# Patient Record
Sex: Female | Born: 1960 | Race: White | Hispanic: No | Smoking: Never smoker
Health system: Southern US, Community
[De-identification: ages and names within clinical notes are randomized; demographics above are authoritative.]

## PROBLEM LIST (undated history)

## (undated) DIAGNOSIS — I10 Essential (primary) hypertension: Secondary | ICD-10-CM

## (undated) DIAGNOSIS — R42 Dizziness and giddiness: Secondary | ICD-10-CM

## (undated) DIAGNOSIS — M1990 Unspecified osteoarthritis, unspecified site: Secondary | ICD-10-CM

## (undated) DIAGNOSIS — M722 Plantar fascial fibromatosis: Secondary | ICD-10-CM

## (undated) DIAGNOSIS — Q600 Renal agenesis, unilateral: Secondary | ICD-10-CM

## (undated) DIAGNOSIS — H548 Legal blindness, as defined in USA: Secondary | ICD-10-CM

## (undated) DIAGNOSIS — Z98811 Dental restoration status: Secondary | ICD-10-CM

## (undated) DIAGNOSIS — J302 Other seasonal allergic rhinitis: Secondary | ICD-10-CM

## (undated) DIAGNOSIS — G4730 Sleep apnea, unspecified: Secondary | ICD-10-CM

## (undated) DIAGNOSIS — M21619 Bunion of unspecified foot: Secondary | ICD-10-CM

## (undated) DIAGNOSIS — M2580 Other specified joint disorders, unspecified joint: Secondary | ICD-10-CM

## (undated) DIAGNOSIS — R000 Tachycardia, unspecified: Secondary | ICD-10-CM

## (undated) DIAGNOSIS — E119 Type 2 diabetes mellitus without complications: Secondary | ICD-10-CM

## (undated) DIAGNOSIS — E7800 Pure hypercholesterolemia, unspecified: Secondary | ICD-10-CM

## (undated) DIAGNOSIS — M719 Bursopathy, unspecified: Secondary | ICD-10-CM

## (undated) DIAGNOSIS — Z973 Presence of spectacles and contact lenses: Secondary | ICD-10-CM

## (undated) DIAGNOSIS — D649 Anemia, unspecified: Secondary | ICD-10-CM

## (undated) HISTORY — PX: TONSILECTOMY, ADENOIDECTOMY, BILATERAL MYRINGOTOMY AND TUBES: SHX2538

## (undated) HISTORY — DX: Bursopathy, unspecified: M71.9

## (undated) HISTORY — DX: Plantar fascial fibromatosis: M72.2

## (undated) HISTORY — DX: Type 2 diabetes mellitus without complications: E11.9

## (undated) HISTORY — DX: Pure hypercholesterolemia, unspecified: E78.00

## (undated) HISTORY — PX: KIDNEY SURGERY: SHX687

## (undated) HISTORY — PX: PARTIAL HYSTERECTOMY: SHX80

## (undated) HISTORY — PX: TUBAL LIGATION: SHX77

## (undated) HISTORY — PX: ABDOMINAL HYSTERECTOMY: SHX81

## (undated) HISTORY — PX: TOE SURGERY: SHX1073

## (undated) HISTORY — DX: Bunion of unspecified foot: M21.619

## (undated) HISTORY — PX: RHINOPLASTY: SUR1284

## (undated) HISTORY — DX: Other specified joint disorders, unspecified joint: M25.80

---

## 2006-08-15 ENCOUNTER — Ambulatory Visit: Payer: Self-pay

## 2007-12-06 ENCOUNTER — Emergency Department: Payer: Self-pay | Admitting: Emergency Medicine

## 2007-12-17 ENCOUNTER — Ambulatory Visit: Payer: Self-pay | Admitting: Gastroenterology

## 2009-07-18 ENCOUNTER — Emergency Department: Payer: Self-pay | Admitting: Emergency Medicine

## 2009-08-02 ENCOUNTER — Emergency Department: Payer: Self-pay | Admitting: Emergency Medicine

## 2010-03-31 ENCOUNTER — Ambulatory Visit: Payer: Self-pay | Admitting: Internal Medicine

## 2010-04-02 ENCOUNTER — Ambulatory Visit: Payer: Self-pay | Admitting: Internal Medicine

## 2010-04-04 ENCOUNTER — Ambulatory Visit: Payer: Self-pay | Admitting: Internal Medicine

## 2010-04-07 ENCOUNTER — Ambulatory Visit: Payer: Self-pay | Admitting: Internal Medicine

## 2010-07-20 DIAGNOSIS — F4322 Adjustment disorder with anxiety: Secondary | ICD-10-CM | POA: Insufficient documentation

## 2010-08-20 ENCOUNTER — Ambulatory Visit: Payer: Self-pay | Admitting: Internal Medicine

## 2010-09-01 ENCOUNTER — Ambulatory Visit: Payer: Self-pay | Admitting: Internal Medicine

## 2010-09-28 ENCOUNTER — Ambulatory Visit: Payer: Self-pay | Admitting: Emergency Medicine

## 2011-11-01 HISTORY — PX: BREAST BIOPSY: SHX20

## 2011-12-02 DIAGNOSIS — I10 Essential (primary) hypertension: Secondary | ICD-10-CM | POA: Insufficient documentation

## 2011-12-02 DIAGNOSIS — E785 Hyperlipidemia, unspecified: Secondary | ICD-10-CM | POA: Insufficient documentation

## 2011-12-09 ENCOUNTER — Ambulatory Visit: Payer: Self-pay | Admitting: Internal Medicine

## 2011-12-23 ENCOUNTER — Ambulatory Visit: Payer: Self-pay | Admitting: Emergency Medicine

## 2011-12-23 LAB — HEMOGLOBIN: HGB: 13.8 g/dL (ref 12.0–16.0)

## 2011-12-29 ENCOUNTER — Ambulatory Visit: Payer: Self-pay | Admitting: Emergency Medicine

## 2011-12-31 LAB — PATHOLOGY REPORT

## 2013-04-23 ENCOUNTER — Ambulatory Visit: Payer: Self-pay | Admitting: Internal Medicine

## 2013-05-23 DIAGNOSIS — M722 Plantar fascial fibromatosis: Secondary | ICD-10-CM

## 2013-05-23 HISTORY — DX: Plantar fascial fibromatosis: M72.2

## 2013-06-20 DIAGNOSIS — M719 Bursopathy, unspecified: Secondary | ICD-10-CM

## 2013-06-20 DIAGNOSIS — M2580 Other specified joint disorders, unspecified joint: Secondary | ICD-10-CM

## 2013-06-20 HISTORY — DX: Other specified joint disorders, unspecified joint: M25.80

## 2013-06-20 HISTORY — DX: Bursopathy, unspecified: M71.9

## 2013-07-22 ENCOUNTER — Encounter: Payer: Self-pay | Admitting: *Deleted

## 2013-07-22 DIAGNOSIS — M719 Bursopathy, unspecified: Secondary | ICD-10-CM | POA: Insufficient documentation

## 2013-07-22 DIAGNOSIS — M2580 Other specified joint disorders, unspecified joint: Secondary | ICD-10-CM | POA: Insufficient documentation

## 2013-07-22 DIAGNOSIS — M21619 Bunion of unspecified foot: Secondary | ICD-10-CM | POA: Insufficient documentation

## 2013-07-22 DIAGNOSIS — M722 Plantar fascial fibromatosis: Secondary | ICD-10-CM

## 2013-08-01 ENCOUNTER — Encounter: Payer: Self-pay | Admitting: Podiatry

## 2013-08-01 ENCOUNTER — Ambulatory Visit (INDEPENDENT_AMBULATORY_CARE_PROVIDER_SITE_OTHER): Payer: BC Managed Care – PPO

## 2013-08-01 ENCOUNTER — Ambulatory Visit (INDEPENDENT_AMBULATORY_CARE_PROVIDER_SITE_OTHER): Payer: BC Managed Care – PPO | Admitting: Podiatry

## 2013-08-01 VITALS — BP 126/86 | HR 90 | Temp 98.0°F | Resp 16 | Ht 68.0 in | Wt 195.2 lb

## 2013-08-01 DIAGNOSIS — M2011 Hallux valgus (acquired), right foot: Secondary | ICD-10-CM

## 2013-08-01 DIAGNOSIS — M2010 Hallux valgus (acquired), unspecified foot: Secondary | ICD-10-CM

## 2013-08-01 NOTE — Progress Notes (Signed)
Gabriella Young presents today for her first postop visit. 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 Painful sesamoiditis IP joint hallux right and a soft tissue mass plantar aspect of the hallux right. She presents today stating that she had to change the dressing because it had blood on it and she has a problem with keeping things neat and clean. She denies fever chills nausea vomiting states it really hasn't hurt too badly.  Objective: Dry sterile dressing was intact today patient presents with her Cam Walker. Dry sterile dressing was removed demonstrates no erythema tali this drainage or odor. Mild edema mild tenderness on range of motion right first metatarsophalangeal joint. Radiographic evaluation demonstrates well-placed internal fixation IPJ fusion hallux right no other osseous abnormalities are noted.  Assessment: Status post McBride bunion repair, hallux interphalangeal joint fusion, IP sesamoidectomy hallux right, excision bursa hallux right, x1 week.  Plan: Redressed today with a dry sterile compressive dressing will followup with her in one week for suture removal.

## 2013-08-01 NOTE — Patient Instructions (Signed)
Continue post op surgical instructions.  Continue limited time up on the foot. Keep dressing intact and dry. Ice and elevate as much as possible.  Follow up with me in one week for suture removal.  Continue antibiotics until finished.    Be careful and we will see you next week.

## 2013-08-08 ENCOUNTER — Ambulatory Visit (INDEPENDENT_AMBULATORY_CARE_PROVIDER_SITE_OTHER): Payer: BC Managed Care – PPO | Admitting: Podiatry

## 2013-08-08 ENCOUNTER — Encounter: Payer: Self-pay | Admitting: Podiatry

## 2013-08-08 VITALS — BP 132/92 | HR 91 | Temp 98.1°F | Resp 16 | Wt 195.2 lb

## 2013-08-08 DIAGNOSIS — M2030 Hallux varus (acquired), unspecified foot: Secondary | ICD-10-CM

## 2013-08-08 DIAGNOSIS — M2010 Hallux valgus (acquired), unspecified foot: Secondary | ICD-10-CM | POA: Insufficient documentation

## 2013-08-08 DIAGNOSIS — M205X1 Other deformities of toe(s) (acquired), right foot: Secondary | ICD-10-CM | POA: Insufficient documentation

## 2013-08-08 DIAGNOSIS — M205X2 Other deformities of toe(s) (acquired), left foot: Secondary | ICD-10-CM

## 2013-08-08 NOTE — Progress Notes (Signed)
Lillianah presents today for followup of her surgical foot right. She is status post 2 weeks now McBride bunion repair right. Hallux interphalangeal joint arthrodesis and excision of bursa plantar aspect of the hallux. She states it seems to be doing okay. She denies fever chills nausea vomiting muscle aches and pains.  Objective: Vital signs are stable she is alert and oriented x3. Pulses are palpable right lower extremity strong. Capillary fill time to digits one through 5 the right foot is immediate. She has a great range of motion of the first metatarsophalangeal joint right foot. It is well coapted sutures are intact. No erythema edema cellulitis drainage or odor.  Assessment: Well-healing surgical foot right date of surgery 07/26/2013.  Plan: Followup with her in 2 weeks for another set of x-rays right foot. She will use Coban and to the hallux right. An anklet to the right foot. And a Darco shoe to the right foot.

## 2013-08-08 NOTE — Patient Instructions (Signed)
Continue to wear the anklet and limit time up on the foot.  May shower. Follow up with me in two weeks.

## 2013-08-15 ENCOUNTER — Encounter: Payer: Self-pay | Admitting: Podiatry

## 2013-08-15 ENCOUNTER — Encounter: Payer: Self-pay | Admitting: *Deleted

## 2013-08-16 ENCOUNTER — Telehealth: Payer: Self-pay | Admitting: *Deleted

## 2013-08-16 NOTE — Telephone Encounter (Signed)
PT CALLED WANTING A NOTE TO WORK FROM HOME TILL 10.24.14. SAID SHE HAD FELL WITH HER BOOT ON AND HER TOE WAS SORE. ASKED IF SHE WOULD LIKE TO COME IN TO SEE DR HYATT AND SAID NO SHE WOULD BE FINE CAUSE I HAD THE BOOT ON WHEN I FELL. I WILL BE OK.

## 2013-08-21 ENCOUNTER — Ambulatory Visit (INDEPENDENT_AMBULATORY_CARE_PROVIDER_SITE_OTHER): Payer: BC Managed Care – PPO

## 2013-08-21 ENCOUNTER — Ambulatory Visit (INDEPENDENT_AMBULATORY_CARE_PROVIDER_SITE_OTHER): Payer: BC Managed Care – PPO | Admitting: Podiatry

## 2013-08-21 ENCOUNTER — Encounter: Payer: Self-pay | Admitting: Podiatry

## 2013-08-21 VITALS — BP 146/90 | HR 85 | Resp 16 | Ht 68.0 in | Wt 192.0 lb

## 2013-08-21 DIAGNOSIS — M2030 Hallux varus (acquired), unspecified foot: Secondary | ICD-10-CM

## 2013-08-21 DIAGNOSIS — M21611 Bunion of right foot: Secondary | ICD-10-CM

## 2013-08-21 DIAGNOSIS — M2031 Hallux varus (acquired), right foot: Secondary | ICD-10-CM

## 2013-08-21 DIAGNOSIS — M21619 Bunion of unspecified foot: Secondary | ICD-10-CM

## 2013-08-21 NOTE — Progress Notes (Signed)
[name] presents today for followup of her first metatarsophalangeal joint repair right as well as IPJ fusion hallux right. She states it is a little sore but is doing pretty good. Date of surgery was 07/26/2013.  Objective: vital signs are stable she is alert and oriented x3. Hallux demonstrates much decrease in edema since the last visit. Radiographic evaluation does demonstrate some mild distraction of the IP arthrodesis attempt.  Assessment: Well-healing surgical foot mild distraction of the IPJ fusion.  Plan: Encouraged range of motion and compression to hallux. I suggested she start putting lotion and or vitamin E oil or cocoa butter on her foot to help with a dry skin. I will followup with her in 2-4 weeks for another set of x-rays continue use of the Darco shoe until then.

## 2013-08-22 ENCOUNTER — Encounter: Payer: Self-pay | Admitting: Podiatry

## 2013-09-05 ENCOUNTER — Ambulatory Visit (INDEPENDENT_AMBULATORY_CARE_PROVIDER_SITE_OTHER): Payer: BC Managed Care – PPO

## 2013-09-05 ENCOUNTER — Encounter: Payer: Self-pay | Admitting: Podiatry

## 2013-09-05 ENCOUNTER — Ambulatory Visit (INDEPENDENT_AMBULATORY_CARE_PROVIDER_SITE_OTHER): Payer: BC Managed Care – PPO | Admitting: Podiatry

## 2013-09-05 ENCOUNTER — Other Ambulatory Visit: Payer: Self-pay

## 2013-09-05 VITALS — BP 134/92 | HR 88 | Resp 16 | Ht 68.0 in | Wt 195.0 lb

## 2013-09-05 DIAGNOSIS — Z9889 Other specified postprocedural states: Secondary | ICD-10-CM

## 2013-09-05 NOTE — Progress Notes (Signed)
Gabriella Young presents today for followup of her McBride bunion repair, hallux IPJ fusion and resection of the bursa to the hallux right. Last time I saw her she was doing quite well she had a fantastic range of motion the first metatarsophalangeal joint right foot. There was minimal edema and minimal pain. Currently she relates that she recently fell with her Darco shoe on, rolling her foot backwards in her foot is been sore ever since.  Objective: She has edema to the right foot tenderness and tingling on palpation of the first metatarsophalangeal joint as well as the IP joint hallux right. Radiographic evaluation today does demonstrate a mild distraction of the IP joint. These were usually the wound healing away.  Assessment: A slight delay in healing secondary to trauma.  Plan: I encouraged her to continue range of motion exercises massage therapy and getting into a loose pair shoes. This should help brace her range of motion and desensitize her. I will followup with her in 6 weeks.

## 2013-10-03 ENCOUNTER — Encounter: Payer: Self-pay | Admitting: Podiatry

## 2013-10-03 ENCOUNTER — Ambulatory Visit (INDEPENDENT_AMBULATORY_CARE_PROVIDER_SITE_OTHER): Payer: BC Managed Care – PPO

## 2013-10-03 ENCOUNTER — Ambulatory Visit (INDEPENDENT_AMBULATORY_CARE_PROVIDER_SITE_OTHER): Payer: BC Managed Care – PPO | Admitting: Podiatry

## 2013-10-03 VITALS — BP 145/90 | HR 90 | Ht 68.0 in | Wt 195.0 lb

## 2013-10-03 DIAGNOSIS — Z9889 Other specified postprocedural states: Secondary | ICD-10-CM

## 2013-10-05 NOTE — Progress Notes (Signed)
Gabriella Young presents today for followup of her McBride bunion repair and her IPJ fusion hallux right she states it is doing a lot better and the swelling is going down.  Objective: Vital signs are stable she is alert and oriented x3. She has a great range of motion of the first metatarsophalangeal joint which might with much decrease in edema to the hallux right. Radiographic evaluation does demonstrate that the hallux has some healing left to do at the arthrodesis site but should going to heal quite nicely.  Assessment: Well-healing surgical foot status post McBride and hallux IPJ arthrodesis.  Plan: Followup with me as needed.

## 2013-10-07 ENCOUNTER — Telehealth: Payer: Self-pay | Admitting: *Deleted

## 2013-10-07 MED ORDER — MELOXICAM 7.5 MG PO TABS: 7.5000 mg | ORAL_TABLET | Freq: Every day | ORAL | Status: DC

## 2013-10-07 NOTE — Telephone Encounter (Signed)
You may refill times 3.

## 2013-10-07 NOTE — Telephone Encounter (Signed)
Fax request from pharmacy requesting mobic 7.5 mg tablets take one tablet by mouth every day

## 2013-11-08 ENCOUNTER — Ambulatory Visit: Payer: Self-pay | Admitting: Nurse Practitioner

## 2013-11-14 ENCOUNTER — Encounter: Payer: BC Managed Care – PPO | Admitting: Podiatry

## 2013-11-18 ENCOUNTER — Encounter: Payer: Self-pay | Admitting: Podiatry

## 2013-11-18 ENCOUNTER — Ambulatory Visit (INDEPENDENT_AMBULATORY_CARE_PROVIDER_SITE_OTHER): Payer: BC Managed Care – PPO | Admitting: Podiatry

## 2013-11-18 ENCOUNTER — Ambulatory Visit (INDEPENDENT_AMBULATORY_CARE_PROVIDER_SITE_OTHER): Payer: BC Managed Care – PPO

## 2013-11-18 VITALS — BP 117/87 | HR 115 | Resp 16

## 2013-11-18 DIAGNOSIS — Z9889 Other specified postprocedural states: Secondary | ICD-10-CM

## 2013-11-18 NOTE — Progress Notes (Signed)
Dos 9.26.14 right foot , doing pretty well, i think he just wanted to check it one last time.  Objective evaluation: Pulses are strongly palpable to the right lower extremity. McBride bunion repair and arthrodesis of the IP joint of the hallux right appears to be healing quite nicely there is no erythema no edema cellulitis drainage or odor. She has no pain on range of motion of the toe. Radiographic evaluation demonstrates well-healing surgical foot right.  Assessment: Well-healing surgical foot status post IPJ fusion and McBride bunion repair right.  Plan: Get back to regular routine all followup with her on as-needed basis.

## 2014-02-07 ENCOUNTER — Other Ambulatory Visit: Payer: Self-pay | Admitting: Podiatry

## 2014-10-05 IMAGING — CR RIGHT FOOT COMPLETE - 3+ VIEW
1 series · 3 of 3 positions shown · non-contrast
Comparison: none

REASON FOR EXAM: Right foot for pain and edema
COMMENTS:

PROCEDURE:     DXR - DXR FOOT RT COMPLETE W/OBLIQUES  - April 23, 2013 [DATE]
RESULT:     Right foot images demonstrate some degenerative change
especially in the first metatarsophalangeal joint without evidence of
fracture, dislocation or foreign body.

[Series 1: ap · 0.17mm/px · 3 of 3 slices shown]
[im 1/3]
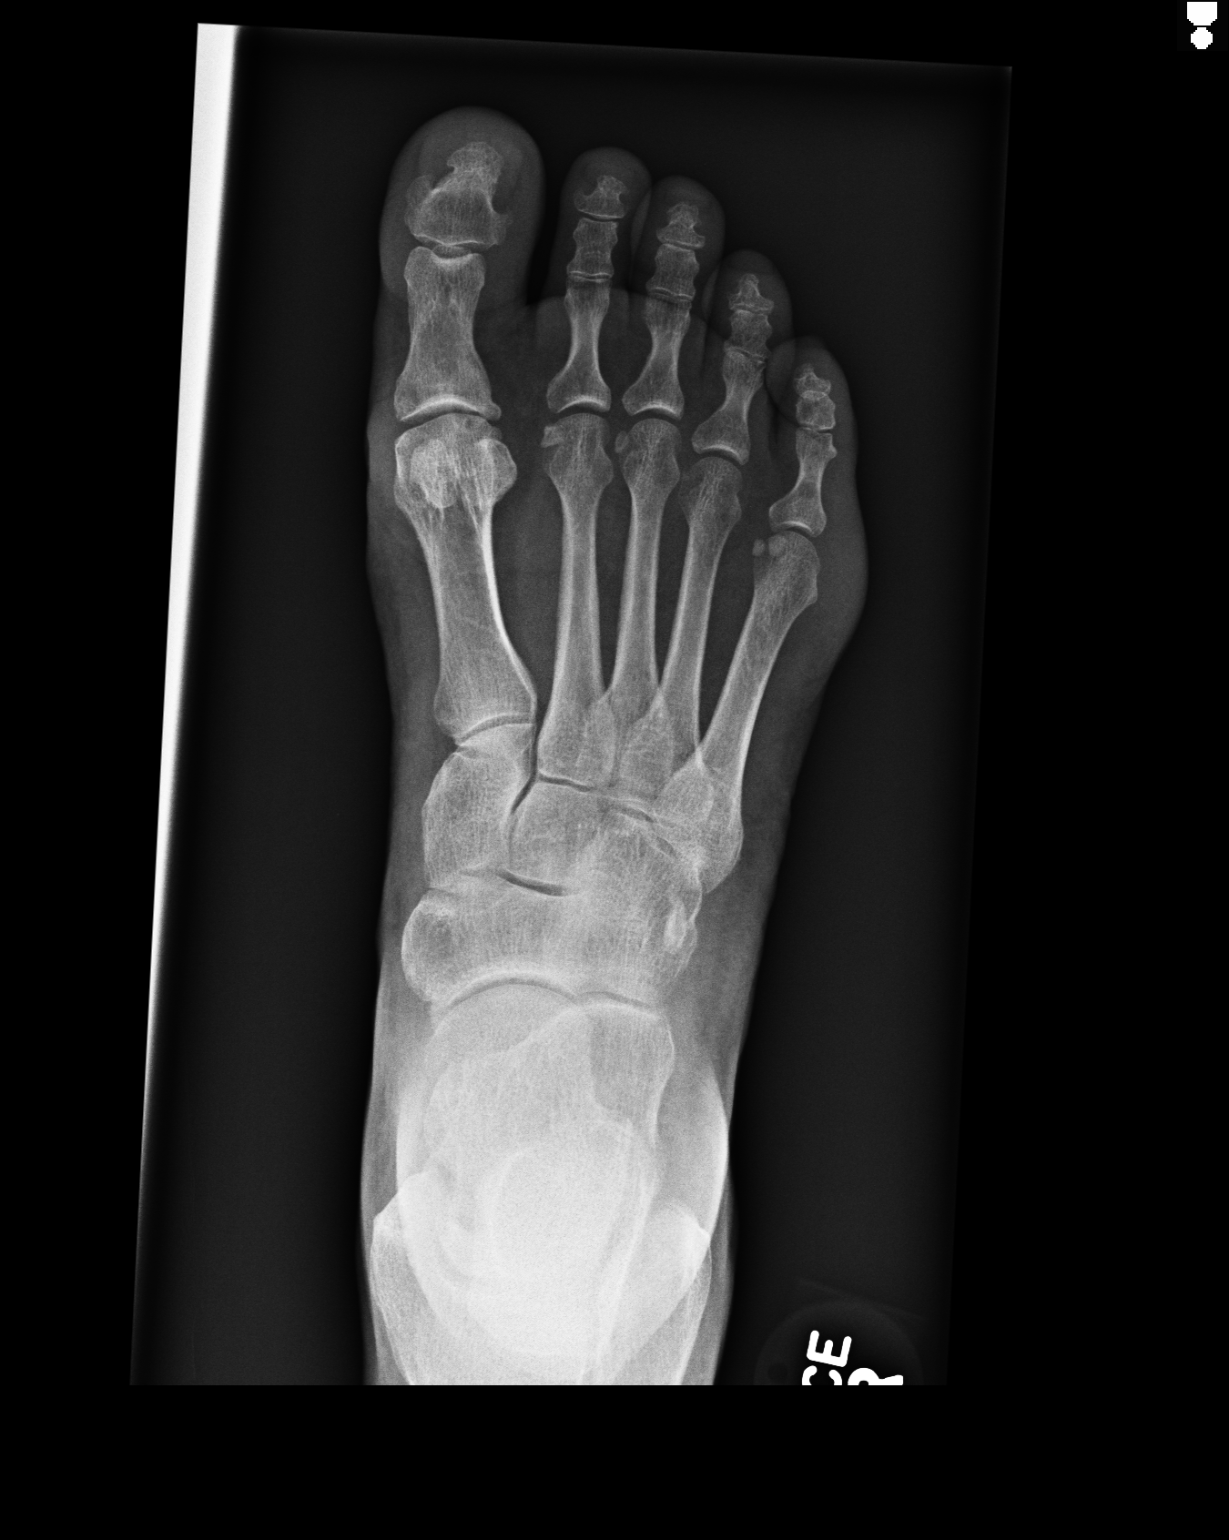
[im 2/3]
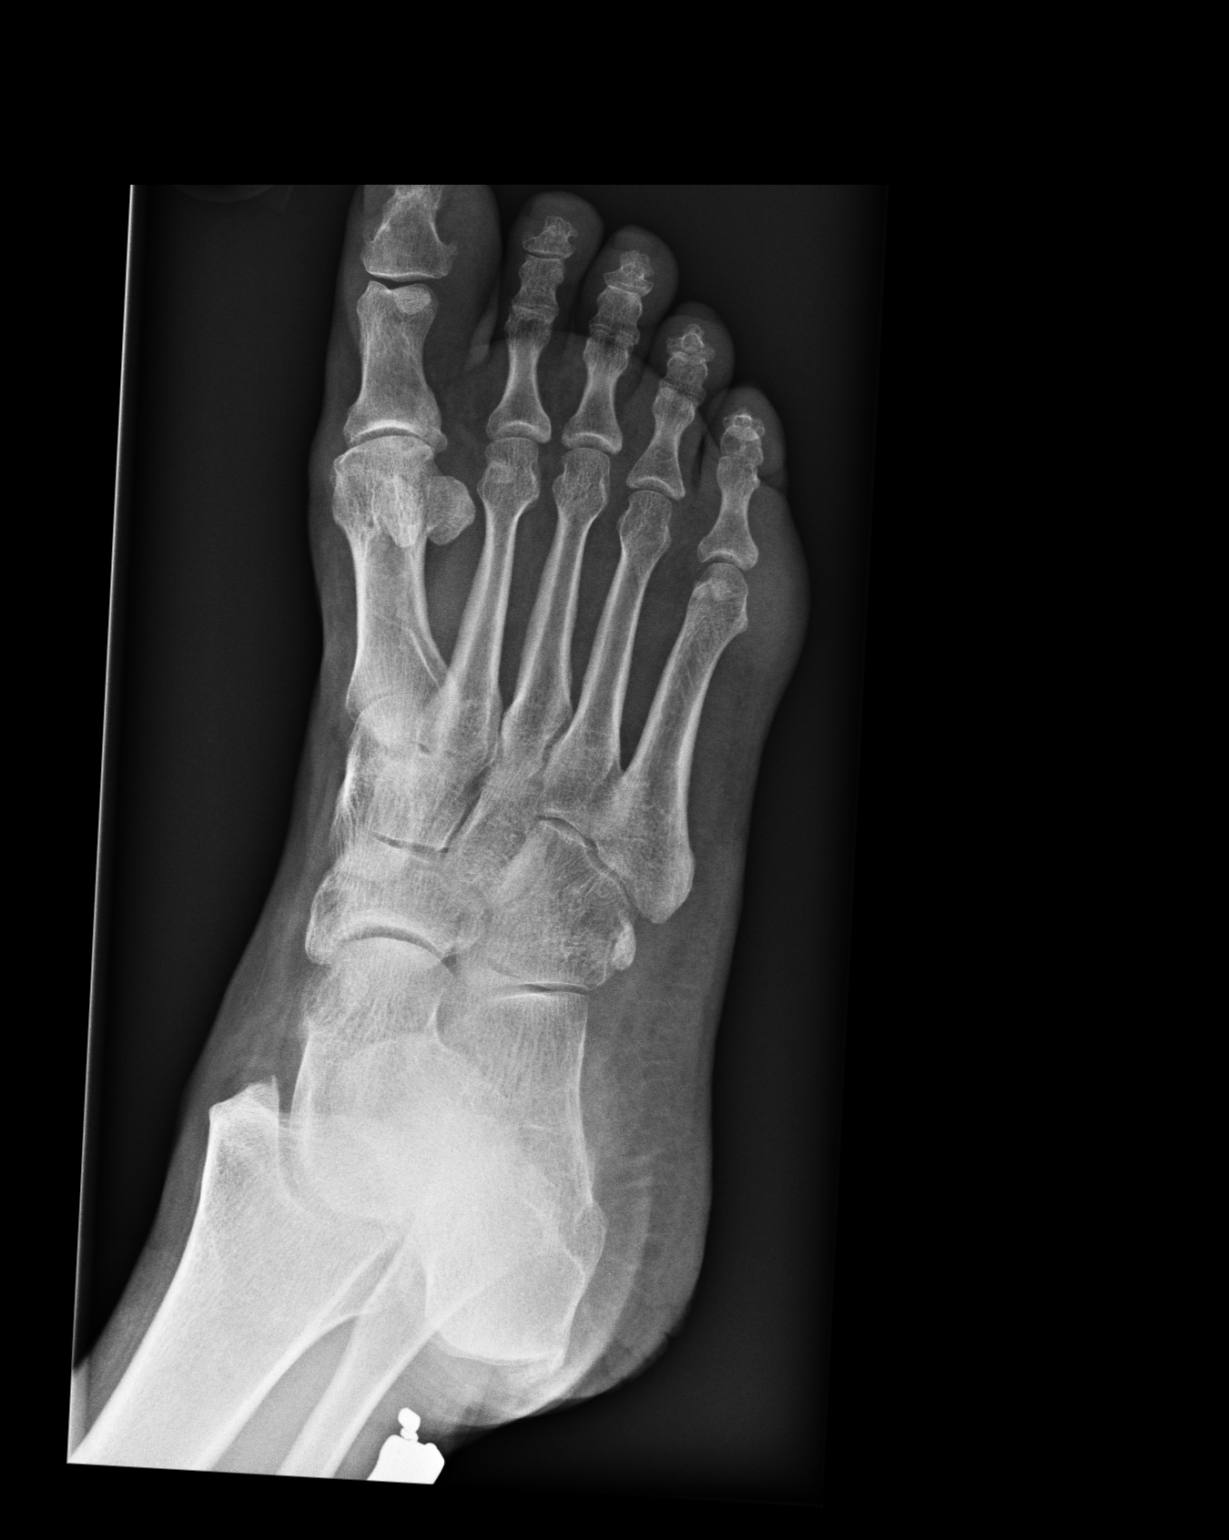
[im 3/3]
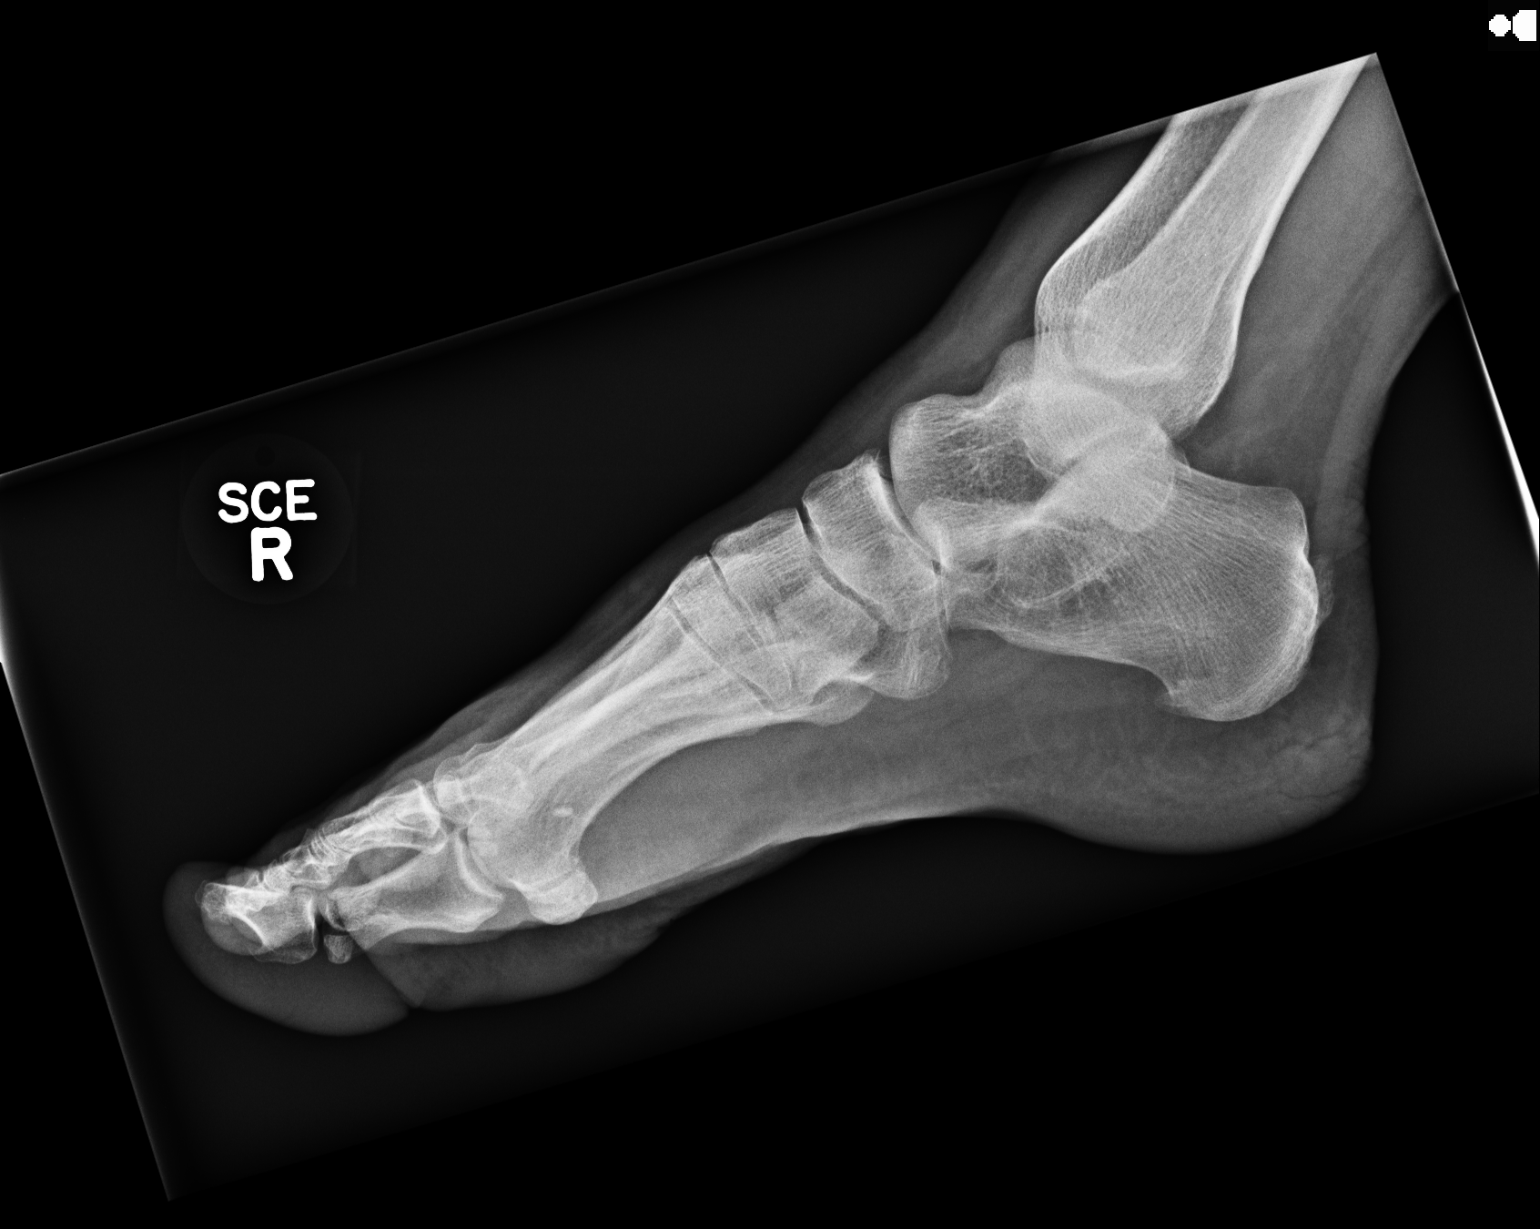

[3 of 3 positions shown; findings below may reference images not displayed]

IMPRESSION: 1. No acute bony abnormality. Mild chronic degenerative changes are present.

[REDACTED]

## 2014-12-11 ENCOUNTER — Ambulatory Visit: Payer: Self-pay | Admitting: Nurse Practitioner

## 2015-02-22 NOTE — Op Note (Signed)
PATIENT NAME:  Gabriella Young, Natsha D MR#:  [id number] DATE OF BIRTH:  Jul 04, 1961  DATE OF PROCEDURE:  12/29/2011  PREOPERATIVE DIAGNOSIS: Left breast tumor.   POSTOPERATIVE DIAGNOSIS: Left breast tumor.   PROCEDURE: Left breast needle localization biopsy.   ANESTHESIA:  General.   DESCRIPTION OF PROCEDURE:  This is a patient who has a history of multiple cysts in the breast. Last time she was scheduled for biopsy and it was aspirated by radiologist. This time they thought she might have more cysts and she had a little solid mass next to them. The patient was then brought to x-ray and they did an ultrasound guided needle localization. She was then brought to surgery under general anesthesia. The left breast was then prepped and draped. An incision was made over the top of the wire. After cutting skin and subcutaneous tissue, the wire does not seem to be that deep. Dissection was done to take out the wire and some of the breast tissue with it. There are multiple cysts in the breast which were then removed with the specimen and after that, bleeding was stopped and the wound was then closed in layers with 3-0 Vicryl and 5-0 Vicryl sutures. Steri-Strips were applied. The patient tolerated the procedure well and was sent to the recovery room in satisfactory condition.    ____________________________ Alton RevereMasud S. Cecelia ByarsHashmi, MD msh:ap D: 12/29/2011 11:52:15 ET T: 12/29/2011 12:18:53 ET JOB#: [id number]  cc: Swain Acree S. Cecelia ByarsHashmi, MD, <Dictator> Margaretann LovelessNeelam S. Khan, MD Meryle ReadyMASUD S Juddson Cobern MD ELECTRONICALLY SIGNED 01/03/2012 13:13

## 2015-03-06 DIAGNOSIS — R079 Chest pain, unspecified: Secondary | ICD-10-CM | POA: Insufficient documentation

## 2015-04-22 IMAGING — MG MM DIGITAL SCREENING BILAT W/ CAD
1 series · 5 of 5 positions shown · non-contrast
Comparison: Previous exam(s).

CLINICAL DATA: Screening.

EXAM:
DIGITAL SCREENING BILATERAL MAMMOGRAM WITH CAD

[R CC · right · 5 of 5 slices shown]
[im 1/5]
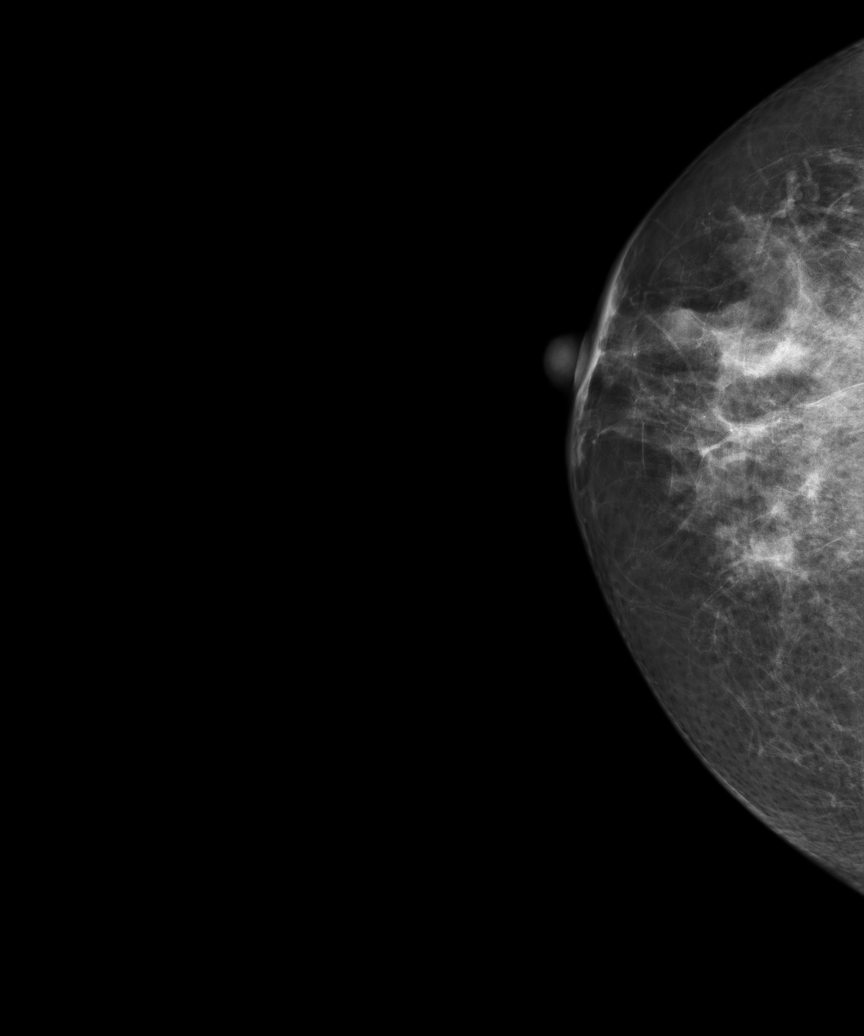
[im 2/5]
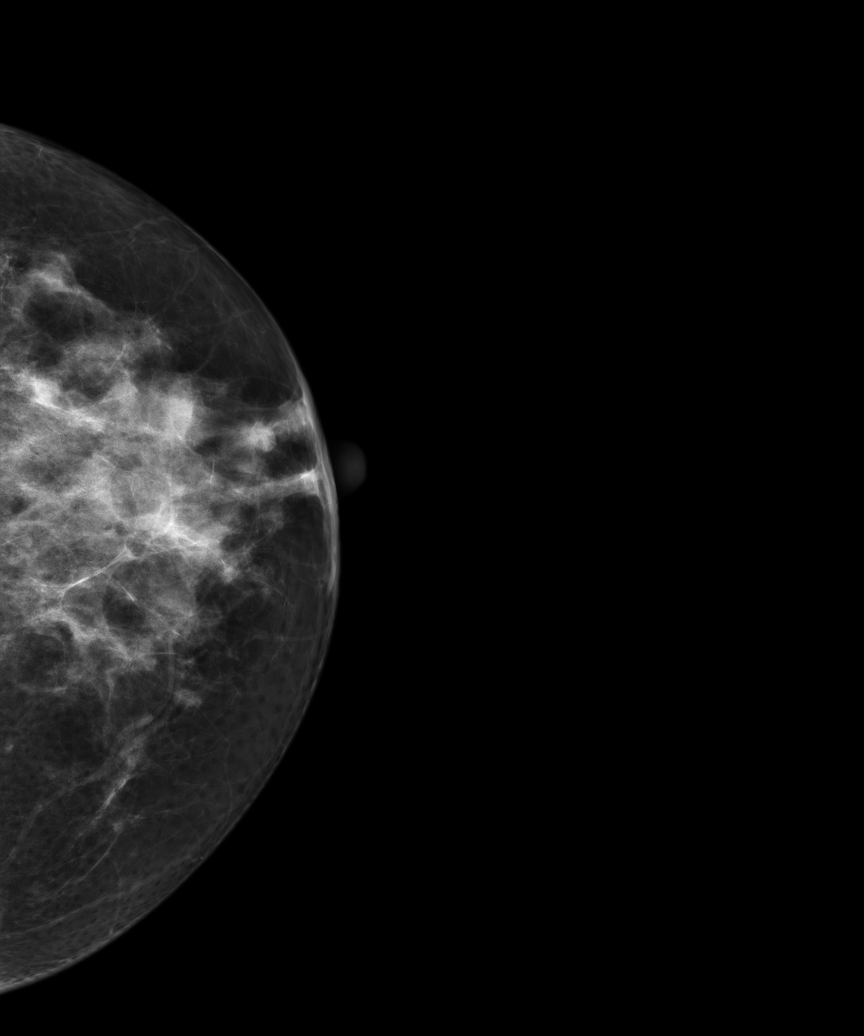
[im 3/5]
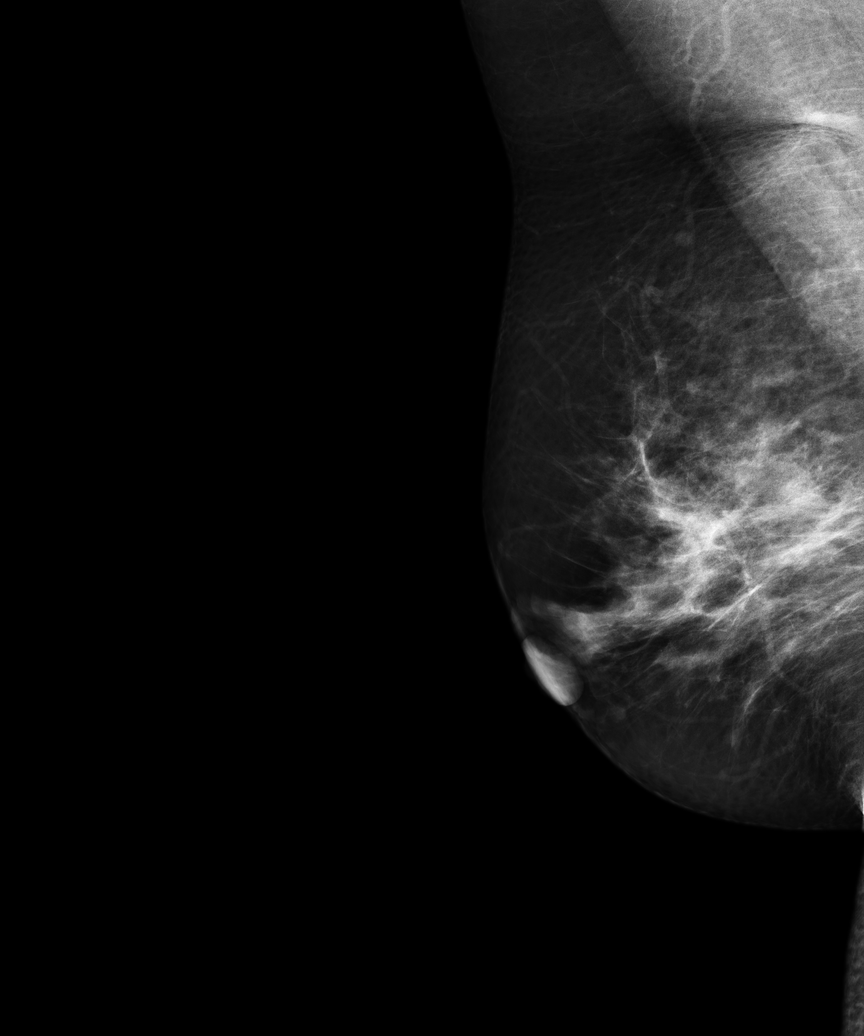
[im 4/5]
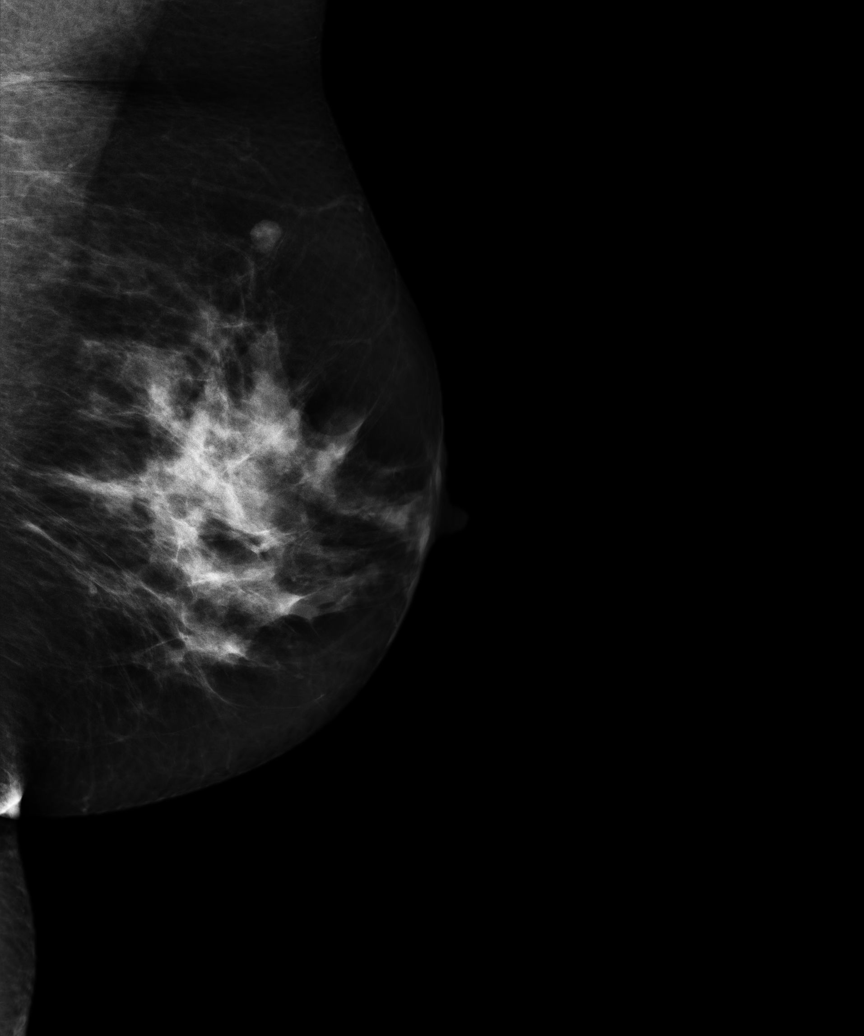
[im 5/5]
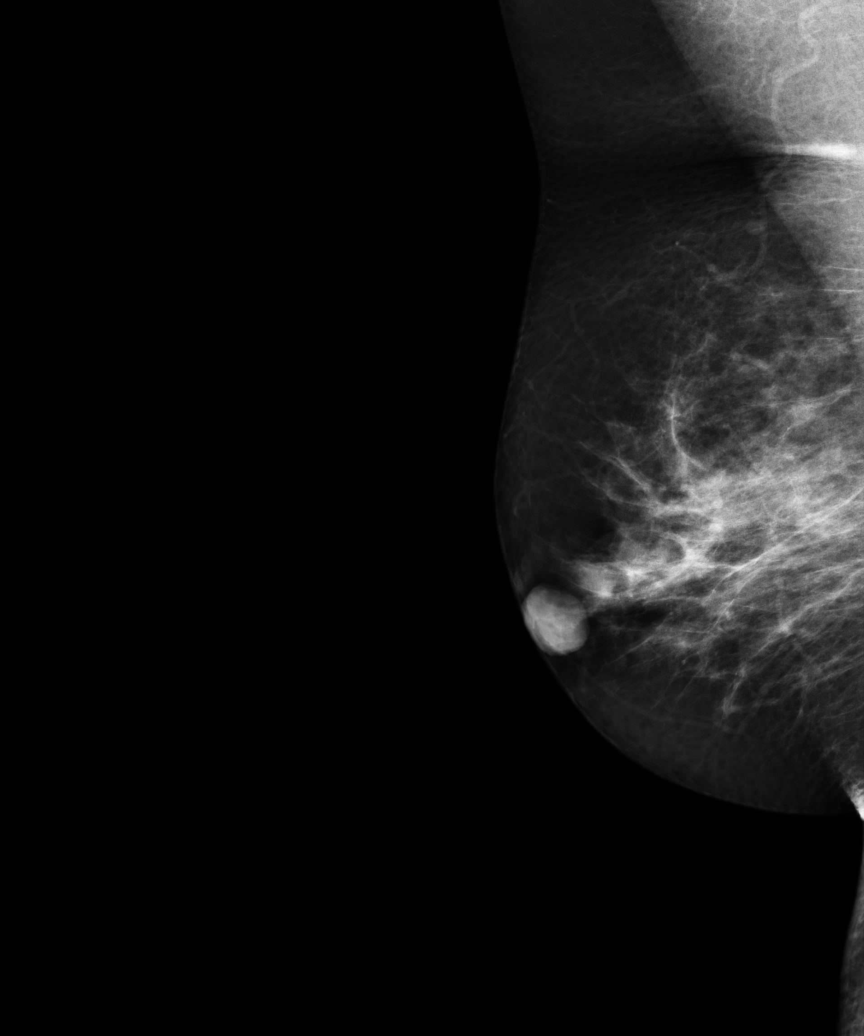

[5 of 5 positions shown; findings below may reference images not displayed]

ACR Breast Density Category d: The breast tissue is extremely dense,
which lowers the sensitivity of mammography.
FINDINGS: There are no findings suspicious for malignancy. Images were
processed with CAD.
IMPRESSION: No mammographic evidence of malignancy. A result letter of this
screening mammogram will be mailed directly to the patient.

RECOMMENDATION:
Screening mammogram in one year. (Code:26-Q-M5K)

BI-RADS CATEGORY  1: Negative

## 2015-07-30 ENCOUNTER — Other Ambulatory Visit: Payer: Self-pay

## 2015-07-30 ENCOUNTER — Telehealth: Payer: Self-pay

## 2015-07-30 NOTE — Telephone Encounter (Signed)
Gastroenterology Pre-Procedure Review  Request Date: 08/28/15 Requesting Physician: Dr.   PATIENT REVIEW QUESTIONS: The patient responded to the following health history questions as indicated:    1. Are you having any GI issues? no 2. Do you have a personal history of Polyps? yes (2008) 3. Do you have a family history of Colon Cancer or Polyps?  No  4. Diabetes Mellitus? no 5. Joint replacements in the past 12 months?no 6. Major health problems in the past 3 months?no 7. Any artificial heart valves, MVP, or defibrillator? Tachycardia    MEDICATIONS & ALLERGIES:    Patient reports the following regarding taking any anticoagulation/antiplatelet therapy:   Plavix, Coumadin, Eliquis, Xarelto, Lovenox, Pradaxa, Brilinta, or Effient? no Aspirin? no  Patient confirms/reports the following medications:  Current Outpatient Prescriptions  Medication Sig Dispense Refill  . alprazolam (XANAX) 2 MG tablet Take 2 mg by mouth daily.    . meloxicam (MOBIC) 7.5 MG tablet Take 1 tablet (7.5 mg total) by mouth daily. 30 tablet 3  . oxyCODONE-acetaminophen (PERCOCET) 10-325 MG per tablet Take by mouth. 1- 2 EVERY 6-8 HOURS AS NEEDED     No current facility-administered medications for this visit.    Patient confirms/reports the following allergies:  Allergies  Allergen Reactions  . Codeine   . Sulfa Antibiotics     No orders of the defined types were placed in this encounter.    AUTHORIZATION INFORMATION Primary Insurance: 1D#: Group #:  Secondary Insurance: 1D#: Group #:  SCHEDULE INFORMATION: Date: 08/28/15 Time: Location: [facility]

## 2015-09-02 ENCOUNTER — Encounter: Payer: Self-pay | Admitting: Anesthesiology

## 2015-09-04 NOTE — Discharge Instructions (Signed)

## 2015-09-07 ENCOUNTER — Ambulatory Visit
Admission: RE | Admit: 2015-09-07 | Payer: BLUE CROSS/BLUE SHIELD | Source: Ambulatory Visit | Admitting: Gastroenterology

## 2015-09-07 ENCOUNTER — Telehealth: Payer: Self-pay | Admitting: Gastroenterology

## 2015-09-07 HISTORY — DX: Renal agenesis, unilateral: Q60.0

## 2015-09-07 HISTORY — DX: Other seasonal allergic rhinitis: J30.2

## 2015-09-07 HISTORY — DX: Tachycardia, unspecified: R00.0

## 2015-09-07 HISTORY — DX: Essential (primary) hypertension: I10

## 2015-09-07 HISTORY — DX: Anemia, unspecified: D64.9

## 2015-09-07 HISTORY — DX: Dental restoration status: Z98.811

## 2015-09-07 HISTORY — DX: Dizziness and giddiness: R42

## 2015-09-07 HISTORY — DX: Unspecified osteoarthritis, unspecified site: M19.90

## 2015-09-07 HISTORY — DX: Legal blindness, as defined in USA: H54.8

## 2015-09-07 SURGERY — COLONOSCOPY WITH PROPOFOL
Anesthesia: Choice

## 2015-09-07 NOTE — Telephone Encounter (Signed)
Patient left a voice message that she needed to cancel colonoscopy because she couldn't drink the second part of her prep without throwing up. Please call to reschedule.

## 2015-09-09 NOTE — Telephone Encounter (Signed)
LVM for pt to return my call.

## 2015-09-16 NOTE — Telephone Encounter (Signed)
LVM again for pt to return my call to reschedule colonoscopy.  

## 2015-12-17 ENCOUNTER — Other Ambulatory Visit: Payer: Self-pay | Admitting: Nurse Practitioner

## 2015-12-17 DIAGNOSIS — Z1231 Encounter for screening mammogram for malignant neoplasm of breast: Secondary | ICD-10-CM

## 2015-12-28 ENCOUNTER — Ambulatory Visit
Admission: RE | Admit: 2015-12-28 | Discharge: 2015-12-28 | Disposition: A | Discharge status: Home / Self Care | Payer: BLUE CROSS/BLUE SHIELD | Source: Ambulatory Visit | Attending: Nurse Practitioner | Admitting: Nurse Practitioner

## 2015-12-28 DIAGNOSIS — Z1231 Encounter for screening mammogram for malignant neoplasm of breast: Secondary | ICD-10-CM | POA: Insufficient documentation

## 2016-06-29 ENCOUNTER — Telehealth: Payer: Self-pay

## 2016-06-29 ENCOUNTER — Other Ambulatory Visit: Payer: Self-pay

## 2016-06-29 ENCOUNTER — Encounter: Payer: Self-pay | Admitting: *Deleted

## 2016-06-29 NOTE — Telephone Encounter (Signed)
Dysphagia R13.10 [facility] 07/07/2016 Please cert

## 2016-06-29 NOTE — Telephone Encounter (Signed)
Gastroenterology Pre-Procedure Review  Request Date: 07/07/2016 Requesting Physician:   PATIENT REVIEW QUESTIONS: The patient responded to the following health history questions as indicated:    1. Are you having any GI issues? yes (mild abdominal pain) 2. Do you have a personal history of Polyps? yes (benign) 3. Do you have a family history of Colon Cancer or Polyps? no 4. Diabetes Mellitus? no 5. Joint replacements in the past 12 months?no 6. Major health problems in the past 3 months?no 7. Any artificial heart valves, MVP, or defibrillator?no    MEDICATIONS & ALLERGIES:    Patient reports the following regarding taking any anticoagulation/antiplatelet therapy:   Plavix, Coumadin, Eliquis, Xarelto, Lovenox, Pradaxa, Brilinta, or Effient? no Aspirin? no  Patient confirms/reports the following medications:  Current Outpatient Prescriptions  Medication Sig Dispense Refill  . alprazolam (XANAX) 2 MG tablet Take 0.5 mg by mouth daily. AM    . Biotin (BIOTIN MAXIMUM STRENGTH) 10 MG TABS Take by mouth. AM    . lisinopril-hydrochlorothiazide (PRINZIDE,ZESTORETIC) 10-12.5 MG tablet Take 1 tablet by mouth daily.    Marland Kitchen. OVER THE COUNTER MEDICATION SKINNY FIBER    . Sertraline HCl (ZOLOFT PO) Take 37.5 mg by mouth.    . simvastatin (ZOCOR) 10 MG tablet Take 10 mg by mouth daily. AM     No current facility-administered medications for this visit.     Patient confirms/reports the following allergies:  Allergies  Allergen Reactions  . Codeine Other (See Comments)    MAKES DROWSY/ NOT REALLY ALLERGY, DOESN'T LIKE THE FEELING  . Sulfa Antibiotics Hives    No orders of the defined types were placed in this encounter.   AUTHORIZATION INFORMATION Primary Insurance: 1D#: Group #:  Secondary Insurance: 1D#: Group #:  SCHEDULE INFORMATION: Date: 07/07/2016 Time: Location: [facility]

## 2016-07-06 NOTE — Discharge Instructions (Signed)

## 2016-07-07 ENCOUNTER — Ambulatory Visit: Payer: BLUE CROSS/BLUE SHIELD | Admitting: Anesthesiology

## 2016-07-07 ENCOUNTER — Encounter
Admission: RE | Disposition: A | Discharge status: Home / Self Care | Payer: Self-pay | Source: Ambulatory Visit | Attending: Gastroenterology

## 2016-07-07 ENCOUNTER — Ambulatory Visit
Admission: RE | Admit: 2016-07-07 | Discharge: 2016-07-07 | Disposition: A | Discharge status: Home / Self Care | Payer: BLUE CROSS/BLUE SHIELD | Source: Ambulatory Visit | Attending: Gastroenterology | Admitting: Gastroenterology

## 2016-07-07 DIAGNOSIS — M19071 Primary osteoarthritis, right ankle and foot: Secondary | ICD-10-CM | POA: Insufficient documentation

## 2016-07-07 DIAGNOSIS — M19049 Primary osteoarthritis, unspecified hand: Secondary | ICD-10-CM | POA: Insufficient documentation

## 2016-07-07 DIAGNOSIS — M19072 Primary osteoarthritis, left ankle and foot: Secondary | ICD-10-CM | POA: Insufficient documentation

## 2016-07-07 DIAGNOSIS — Z803 Family history of malignant neoplasm of breast: Secondary | ICD-10-CM | POA: Diagnosis not present

## 2016-07-07 DIAGNOSIS — E7800 Pure hypercholesterolemia, unspecified: Secondary | ICD-10-CM | POA: Insufficient documentation

## 2016-07-07 DIAGNOSIS — Z9889 Other specified postprocedural states: Secondary | ICD-10-CM | POA: Diagnosis not present

## 2016-07-07 DIAGNOSIS — K219 Gastro-esophageal reflux disease without esophagitis: Secondary | ICD-10-CM | POA: Diagnosis not present

## 2016-07-07 DIAGNOSIS — Z90710 Acquired absence of both cervix and uterus: Secondary | ICD-10-CM | POA: Insufficient documentation

## 2016-07-07 DIAGNOSIS — I10 Essential (primary) hypertension: Secondary | ICD-10-CM | POA: Insufficient documentation

## 2016-07-07 DIAGNOSIS — R1310 Dysphagia, unspecified: Secondary | ICD-10-CM | POA: Diagnosis present

## 2016-07-07 DIAGNOSIS — Z8249 Family history of ischemic heart disease and other diseases of the circulatory system: Secondary | ICD-10-CM | POA: Diagnosis not present

## 2016-07-07 DIAGNOSIS — Z79899 Other long term (current) drug therapy: Secondary | ICD-10-CM | POA: Insufficient documentation

## 2016-07-07 DIAGNOSIS — K222 Esophageal obstruction: Secondary | ICD-10-CM | POA: Diagnosis not present

## 2016-07-07 DIAGNOSIS — M170 Bilateral primary osteoarthritis of knee: Secondary | ICD-10-CM | POA: Diagnosis not present

## 2016-07-07 DIAGNOSIS — Z905 Acquired absence of kidney: Secondary | ICD-10-CM | POA: Insufficient documentation

## 2016-07-07 DIAGNOSIS — Z882 Allergy status to sulfonamides status: Secondary | ICD-10-CM | POA: Insufficient documentation

## 2016-07-07 DIAGNOSIS — Z885 Allergy status to narcotic agent status: Secondary | ICD-10-CM | POA: Insufficient documentation

## 2016-07-07 HISTORY — DX: Presence of spectacles and contact lenses: Z97.3

## 2016-07-07 HISTORY — PX: ESOPHAGEAL DILATION: SHX303

## 2016-07-07 SURGERY — DILATION, ESOPHAGUS
Anesthesia: Monitor Anesthesia Care | Wound class: Clean Contaminated

## 2016-07-07 MED ORDER — GLYCOPYRROLATE 0.2 MG/ML IJ SOLN
INTRAMUSCULAR | Status: DC | PRN
  Administered 2016-07-07: 0.1 mg via INTRAVENOUS

## 2016-07-07 MED ORDER — LIDOCAINE HCL (CARDIAC) 20 MG/ML IV SOLN
INTRAVENOUS | Status: DC | PRN
  Administered 2016-07-07: 50 mg via INTRAVENOUS

## 2016-07-07 MED ORDER — ACETAMINOPHEN 325 MG PO TABS: 325.0000 mg | ORAL_TABLET | ORAL | Status: DC | PRN

## 2016-07-07 MED ORDER — LACTATED RINGERS IV SOLN
INTRAVENOUS | Status: DC
  Administered 2016-07-07: 08:00:00 via INTRAVENOUS

## 2016-07-07 MED ORDER — ACETAMINOPHEN 160 MG/5ML PO SOLN: 325.0000 mg | ORAL | Status: DC | PRN

## 2016-07-07 MED ORDER — PROPOFOL 10 MG/ML IV BOLUS
INTRAVENOUS | Status: DC | PRN
  Administered 2016-07-07 (×2): 20 mg via INTRAVENOUS
  Administered 2016-07-07: 70 mg via INTRAVENOUS
  Administered 2016-07-07 (×2): 20 mg via INTRAVENOUS

## 2016-07-07 SURGICAL SUPPLY — 32 items
BALLN DILATOR 10-12 8 (BALLOONS)
BALLN DILATOR 12-15 8 (BALLOONS)
BALLN DILATOR 15-18 8 (BALLOONS)
BALLN DILATOR CRE 0-12 8 (BALLOONS)
BALLN DILATOR ESOPH 8 10 CRE (MISCELLANEOUS) IMPLANT
BALLOON DILATOR 12-15 8 (BALLOONS) IMPLANT
BALLOON DILATOR 15-18 8 (BALLOONS) IMPLANT
BALLOON DILATOR CRE 0-12 8 (BALLOONS) IMPLANT
BLOCK BITE 60FR ADLT L/F GRN (MISCELLANEOUS) ×2 IMPLANT
CANISTER SUCT 1200ML W/VALVE (MISCELLANEOUS) ×2 IMPLANT
CLIP HMST 235XBRD CATH ROT (MISCELLANEOUS) IMPLANT
CLIP RESOLUTION 360 11X235 (MISCELLANEOUS)
FCP ESCP3.2XJMB 240X2.8X (MISCELLANEOUS)
FORCEPS BIOP RAD 4 LRG CAP 4 (CUTTING FORCEPS) ×2 IMPLANT
FORCEPS BIOP RJ4 240 W/NDL (MISCELLANEOUS)
FORCEPS ESCP3.2XJMB 240X2.8X (MISCELLANEOUS) IMPLANT
GOWN CVR UNV OPN BCK APRN NK (MISCELLANEOUS) ×2 IMPLANT
GOWN ISOL THUMB LOOP REG UNIV (MISCELLANEOUS) ×2
INJECTOR VARIJECT VIN23 (MISCELLANEOUS) IMPLANT
KIT DEFENDO VALVE AND CONN (KITS) IMPLANT
KIT ENDO PROCEDURE OLY (KITS) ×2 IMPLANT
MARKER SPOT ENDO TATTOO 5ML (MISCELLANEOUS) IMPLANT
PAD GROUND ADULT SPLIT (MISCELLANEOUS) IMPLANT
RETRIEVER NET PLAT FOOD (MISCELLANEOUS) IMPLANT
SNARE SHORT THROW 13M SML OVAL (MISCELLANEOUS) IMPLANT
SNARE SHORT THROW 30M LRG OVAL (MISCELLANEOUS) IMPLANT
SPOT EX ENDOSCOPIC TATTOO (MISCELLANEOUS)
SYR INFLATION 60ML (SYRINGE) IMPLANT
TRAP ETRAP POLY (MISCELLANEOUS) IMPLANT
VARIJECT INJECTOR VIN23 (MISCELLANEOUS)
WATER STERILE IRR 250ML POUR (IV SOLUTION) ×2 IMPLANT
WIRE CRE 18-20MM 8CM F G (MISCELLANEOUS) IMPLANT

## 2016-07-07 NOTE — Anesthesia Preprocedure Evaluation (Signed)
Anesthesia Evaluation  Patient identified by MRN, date of birth, ID band Patient awake    Reviewed: Allergy & Precautions, H&P , NPO status , Patient's Chart, lab work & pertinent test results  Airway Mallampati: II  TM Distance: >3 FB Neck ROM: full    Dental no notable dental hx.    Pulmonary    Pulmonary exam normal        Cardiovascular hypertension, Normal cardiovascular exam     Neuro/Psych    GI/Hepatic   Endo/Other    Renal/GU      Musculoskeletal   Abdominal   Peds  Hematology   Anesthesia Other Findings   Reproductive/Obstetrics                             Anesthesia Physical Anesthesia Plan  ASA: II  Anesthesia Plan: MAC   Post-op Pain Management:    Induction:   Airway Management Planned:   Additional Equipment:   Intra-op Plan:   Post-operative Plan:   Informed Consent: I have reviewed the patients History and Physical, chart, labs and discussed the procedure including the risks, benefits and alternatives for the proposed anesthesia with the patient or authorized representative who has indicated his/her understanding and acceptance.     Plan Discussed with:   Anesthesia Plan Comments:         Anesthesia Quick Evaluation  

## 2016-07-07 NOTE — Op Note (Signed)
[facility] Gastroenterology Patient Name: Gabriella Young Procedure Date: 07/07/2016 8:29 AM MRN: [id number] Account #: [id number] Date of Birth: 27-Aug-1961 Admit Type: Outpatient Age: 55 Room: [facility] OR ROOM 01 Gender: Female Note Status: Finalized Procedure:            Upper GI endoscopy Indications:          Dysphagia Providers:            Midge Minium MD, MD Referring MD:         [name], MD (Referring MD) Medicines:            Propofol per Anesthesia Complications:        No immediate complications. Procedure:            Pre-Anesthesia Assessment:                       - Prior to the procedure, a History and Physical was                        performed, and patient medications and allergies were                        reviewed. The patient's tolerance of previous                        anesthesia was also reviewed. The risks and benefits of                        the procedure and the sedation options and risks were                        discussed with the patient. All questions were                        answered, and informed consent was obtained. Prior                        Anticoagulants: The patient has taken no previous                        anticoagulant or antiplatelet agents. ASA Grade                        Assessment: II - A patient with mild systemic disease.                        After reviewing the risks and benefits, the patient was                        deemed in satisfactory condition to undergo the                        procedure.                       After obtaining informed consent, the endoscope was                        passed under direct vision. Throughout the procedure,  the patient's blood pressure, pulse, and oxygen                        saturations were monitored continuously. The Olympus                        GIF H180J endoscope (S#: [id number]) was introduced                        through the  mouth, and advanced to the second part of                        duodenum. The upper GI endoscopy was accomplished                        without difficulty. The patient tolerated the procedure                        well. Findings:      One mild benign-appearing, intrinsic stenosis was found at the       gastroesophageal junction. And was traversed. The scope was withdrawn.       Dilation was performed with a Maloney dilator with no resistance at 54       Fr. The dilation site was examined following endoscope reinsertion and       showed complete resolution of luminal narrowing. Two biopsies were       obtained in the middle third of the esophagus with cold forceps for       histology.      The stomach was normal.      The examined duodenum was normal. Impression:           - Benign-appearing esophageal stenosis. Dilated.                       - Normal stomach.                       - Normal examined duodenum.                       - Two biopsies were obtained in the middle third of the                        esophagus. Recommendation:       - Await pathology results. Procedure Code(s):    --- Professional ---                       [phone], Esophagogastroduodenoscopy, flexible, transoral;                        with biopsy, single or multiple                       43450, Dilation of esophagus, by unguided sound or                        bougie, single or multiple passes Diagnosis Code(s):    --- Professional ---                       R13.10, Dysphagia, unspecified  K22.2, Esophageal obstruction CPT copyright 2016 American Medical Association. All rights reserved. The codes documented in this report are preliminary and upon coder review may  be revised to meet current compliance requirements. Midge Minium MD, MD 07/07/2016 8:46:30 AM This report has been signed electronically. Number of Addenda: 0 Note Initiated On: 07/07/2016 8:29 AM Total Procedure Duration: 0  hours 3 minutes 2 seconds       [name]

## 2016-07-07 NOTE — Anesthesia Postprocedure Evaluation (Signed)
Anesthesia Post Note  Patient: Gabriella Young  Procedure(s) Performed: Procedure(s) (LRB): ESOPHAGOGASTRODUODENOSCOPY (EGD) (N/A)  Patient location during evaluation: PACU Anesthesia Type: MAC Level of consciousness: awake and alert and oriented Pain management: satisfactory to patient Vital Signs Assessment: post-procedure vital signs reviewed and stable Respiratory status: spontaneous breathing, nonlabored ventilation and respiratory function stable Cardiovascular status: blood pressure returned to baseline and stable Postop Assessment: Adequate PO intake and No signs of nausea or vomiting Anesthetic complications: no    Cherly BeachStella, Gerrit Rafalski J

## 2016-07-07 NOTE — Transfer of Care (Signed)
Immediate Anesthesia Transfer of Care Note  Patient: Gabriella Young  Procedure(s) Performed: Procedure(s): ESOPHAGOGASTRODUODENOSCOPY (EGD) (N/A)  Patient Location: PACU  Anesthesia Type: MAC  Level of Consciousness: awake, alert  and patient cooperative  Airway and Oxygen Therapy: Patient Spontanous Breathing and Patient connected to supplemental oxygen  Post-op Assessment: Post-op Vital signs reviewed, Patient's Cardiovascular Status Stable, Respiratory Function Stable, Patent Airway and No signs of Nausea or vomiting  Post-op Vital Signs: Reviewed and stable  Complications: No apparent anesthesia complications

## 2016-07-07 NOTE — Anesthesia Procedure Notes (Signed)
Procedure Name: MAC Performed by: Phyllis Abelson Pre-anesthesia Checklist: Patient identified, Emergency Drugs available, Suction available, Timeout performed and Patient being monitored Patient Re-evaluated:Patient Re-evaluated prior to inductionOxygen Delivery Method: Nasal cannula Placement Confirmation: positive ETCO2       

## 2016-07-07 NOTE — H&P (Signed)
Gabriella Minium, MD [facility] [address] [place], [place] [address] Phone: [phone] Fax : [phone]  Primary Care Physician:  [name], MD Primary Gastroenterologist:  Dr. Servando Snare  Pre-Procedure History & Physical: HPI:  Gabriella Young is a 55 y.o. female is here for an endoscopy.   Past Medical History:  Diagnosis Date  . Anemia    HX OF/ RESOLVED AFTER HYSTERECTOMY  . Arthritis    HAND,FEET,KNEES  . Bunion right   08.21.2014, surgery scheduled 9.26.14  . Bursitis 08.21.14   plantar aspect of hallux right  . Congenital absence of one kidney    RIGHT KIDNEY NEVER DEVELOPED, REMOVED AT 55 YEARS OLD  . Dental crowns present    VENEERS ON FRONT UPPER TEETH  . High cholesterol   . Hypertension    CONTROLLED ON MEDS  . Legally blind    WEARS CONTACTS  . Plantar fasciitis of right foot 07.24.14   WITH LATERAL COMPENSATORY SYNDROME  . Seasonal allergies   . Sesamoiditis 08.21.2014    HALLUX RIGHT AND HALLUX INTERPHALANGEUM  . Tachycardia    ON MEDS/ DR Milta Deiters   . Vertigo    HX OF THREE YEARS AGO  . Wears contact lenses     Past Surgical History:  Procedure Laterality Date  . ABDOMINAL HYSTERECTOMY    . BREAST BIOPSY Left 2013  . KIDNEY SURGERY Right    REMOVED  . PARTIAL HYSTERECTOMY    . RHINOPLASTY    . TOE SURGERY Right    screw in great toe  . TONSILECTOMY, ADENOIDECTOMY, BILATERAL MYRINGOTOMY AND TUBES    . TUBAL LIGATION      Prior to Admission medications   Medication Sig Start Date End Date Taking? Authorizing Provider  alprazolam Prudy Feeler) 2 MG tablet Take 0.5 mg by mouth daily. AM   Yes Historical Provider, MD  Biotin (BIOTIN MAXIMUM STRENGTH) 10 MG TABS Take by mouth. AM (plus keratin 250 mg)   Yes Historical Provider, MD  Cyanocobalamin (VITAMIN B-12 PO) Take by mouth daily.   Yes Historical Provider, MD  lisinopril-hydrochlorothiazide (PRINZIDE,ZESTORETIC) 10-12.5 MG tablet Take 1 tablet by mouth daily.   Yes Historical Provider, MD    Multiple Vitamin (MULTIVITAMIN) tablet Take 1 tablet by mouth daily.   Yes Historical Provider, MD  phentermine 37.5 MG capsule Take 37.5 mg by mouth daily.   Yes Historical Provider, MD  Sertraline HCl (ZOLOFT PO) Take 37.5 mg by mouth.   Yes Historical Provider, MD  simvastatin (ZOCOR) 10 MG tablet Take 10 mg by mouth daily. AM   Yes Historical Provider, MD    Allergies as of 06/29/2016 - Review Complete 06/29/2016  Allergen Reaction Noted  . Codeine Other (See Comments) 07/22/2013  . Sulfa antibiotics Hives 07/22/2013    Family History  Problem Relation Age of Onset  . Heart attack Father   . Breast cancer Paternal Aunt 33    Social History   Social History  . Marital status: Married    Spouse name: N/A  . Number of children: N/A  . Years of education: N/A   Occupational History  . Not on file.   Social History Main Topics  . Smoking status: Never Smoker  . Smokeless tobacco: Never Used  . Alcohol use No  . Drug use: No  . Sexual activity: Not on file   Other Topics Concern  . Not on file   Social History Narrative  . No narrative on file    Review of Systems:  See HPI, otherwise negative ROS  Physical Exam: BP 117/83   Pulse 92   Temp 97.9 F (36.6 C) (Temporal)   Resp 16   Ht 5\' 8"  (1.727 m)   Wt 176 lb (79.8 kg)   SpO2 98%   BMI 26.76 kg/m  General:   Alert,  pleasant and cooperative in NAD Head:  Normocephalic and atraumatic. Neck:  Supple; no masses or thyromegaly. Lungs:  Clear throughout to auscultation.    Heart:  Regular rate and rhythm. Abdomen:  Soft, nontender and nondistended. Normal bowel sounds, without guarding, and without rebound.   Neurologic:  Alert and  oriented x4;  grossly normal neurologically.  Impression/Plan: [name] is here for an endoscopy to be performed for dysphagia  Risks, benefits, limitations, and alternatives regarding  endoscopy have been reviewed with the patient.  Questions have been answered.  All  parties agreeable.   Gabriella Miniumarren Tasha Jindra, MD  07/07/2016, 8:28 AM

## 2016-07-08 ENCOUNTER — Encounter: Payer: Self-pay | Admitting: Gastroenterology

## 2016-07-11 ENCOUNTER — Encounter: Payer: Self-pay | Admitting: Gastroenterology

## 2016-07-12 ENCOUNTER — Encounter: Payer: Self-pay | Admitting: Gastroenterology

## 2016-07-13 ENCOUNTER — Telehealth: Payer: Self-pay

## 2016-07-13 ENCOUNTER — Other Ambulatory Visit: Payer: Self-pay

## 2016-07-13 DIAGNOSIS — K2100 Gastro-esophageal reflux disease with esophagitis, without bleeding: Secondary | ICD-10-CM

## 2016-07-13 MED ORDER — PANTOPRAZOLE SODIUM 40 MG PO TBEC
40.0000 mg | DELAYED_RELEASE_TABLET | Freq: Every day | ORAL | 11 refills | Status: DC
Start: 2016-07-13 — End: 2017-06-03

## 2016-07-13 NOTE — Telephone Encounter (Signed)
-----   Message from Midge Miniumarren Wohl, MD sent at 07/12/2016 11:12 AM EDT ----- Let the patient know the biopsies of the esophagus showed signs of damage from reflux. If the patient is not on a PPI please start her on one.

## 2016-07-13 NOTE — Telephone Encounter (Signed)
Pt returned call and notified of EGD results. Pt not currently taking a PPI. Pantoprazole 40mg  sent to [facility] [place] per pt request.

## 2016-07-13 NOTE — Telephone Encounter (Signed)
LVM for pt to return my call.

## 2016-08-18 ENCOUNTER — Ambulatory Visit: Payer: BLUE CROSS/BLUE SHIELD | Admitting: Neurology

## 2016-08-24 ENCOUNTER — Encounter: Payer: Self-pay | Admitting: Neurology

## 2016-08-24 ENCOUNTER — Encounter: Payer: Self-pay | Admitting: *Deleted

## 2016-09-05 ENCOUNTER — Ambulatory Visit (INDEPENDENT_AMBULATORY_CARE_PROVIDER_SITE_OTHER): Payer: BLUE CROSS/BLUE SHIELD | Admitting: Neurology

## 2016-09-05 ENCOUNTER — Encounter: Payer: Self-pay | Admitting: Neurology

## 2016-09-05 VITALS — BP 122/81 | HR 109 | Ht 68.0 in | Wt 181.6 lb

## 2016-09-05 DIAGNOSIS — R4781 Slurred speech: Secondary | ICD-10-CM | POA: Diagnosis not present

## 2016-09-05 DIAGNOSIS — R202 Paresthesia of skin: Secondary | ICD-10-CM | POA: Diagnosis not present

## 2016-09-05 DIAGNOSIS — R413 Other amnesia: Secondary | ICD-10-CM

## 2016-09-05 DIAGNOSIS — G458 Other transient cerebral ischemic attacks and related syndromes: Secondary | ICD-10-CM

## 2016-09-05 MED ORDER — ASPIRIN EC 81 MG PO TBEC: 81.0000 mg | DELAYED_RELEASE_TABLET | Freq: Every day | ORAL | 0 refills | Status: DC

## 2016-09-05 NOTE — Progress Notes (Addendum)
[facility]    Provider:  Dr Lucia Gaskins Referring Provider: [name], MD Primary Care Physician:  [name], MD  CC:  TIA and frontotemporal atrophy of the brain  HPI:  Gabriella Young is a 55 y.o. female here as a referral from Dr. Welton Flakes for a finding of fronto-parietal atrophy on CT of the head. She has a history of dementia in her mother and grandmother.  PMHx HTM, diabetes, hld, heart disease, depression, anxiety. In September she had an excruciating headache, she took ibuprofen and a few hours later she started feeling some numbness in the left face and the hand and arm. Lasted for 30 minutes. She did not go to the ED, she went to [facility] and a CT scan showed fronto-parietal atrophy. Last night she felt like she was getting the flue, she had tingling in the left side of the face which is not unusual for her but her heart rate was 140+. Her grandfather and mother had alzheimers. Mother was 33, grandfather was in his late 73s. She feels like she has some memory changes, forgetting names, but a lot of time she has so much on her mind, she has write notes now, nothing unusual. She has been mor irritable in the past year, diagnosed with PTSD and anxiety and seeing a therapist, she has chronic depression. She was placed on medication and it has helped a lot. No delusions or hallucinations. Her husband pays the bills. Memory changes are small and not progressive. She performs her own ADLs and IADLs. Questions speech slurring sometimes. No weakness, no other focal neurologic deficits. She has a history of head trauma at the age of 22, she fell and hit her head. No history of alcohol use. She had MRSA and has sores on her skin but no other illnesses or meningitis or encephalitis. 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 Her diabetes, HTN and cholesterol well controlled,    Review of Systems: Patient complains of symptoms per HPI as well as the following  symptoms: fatigue, palpitations, feeling hot, flushing, joint pain, alergies, memory loss, numbness, weakness, dizziness, depression, anxiety. Pertinent negatives per HPI. All others negative.   Social History   Social History  . Marital status: Married    Spouse name: Gabriella Young  . Number of children: 2  . Years of education: 16+   Occupational History  . Unemployed    Social History Main Topics  . Smoking status: Never Smoker  . Smokeless tobacco: Never Used  . Alcohol use No  . Drug use: No  . Sexual activity: Not on file   Other Topics Concern  . Not on file   Social History Narrative   Lives with husband, Gabriella Young   Caffeine use: 1 cup coffee 3x/week   Pepsi with meals sometimes     Family History  Problem Relation Age of Onset  . Heart attack Father   . Dementia Mother   . Breast cancer Paternal Aunt 10  . Dementia Maternal Grandmother   . Stroke Maternal Grandmother     Past Medical History:  Diagnosis Date  . Anemia    HX OF/ RESOLVED AFTER HYSTERECTOMY  . Arthritis    HAND,FEET,KNEES  . Bunion right   08.21.2014, surgery scheduled 9.26.14  . Bursitis 08.21.14   plantar aspect of hallux right  . Congenital absence of one kidney    RIGHT KIDNEY NEVER DEVELOPED, REMOVED AT 55 YEARS OLD  . Dental crowns present  VENEERS ON FRONT UPPER TEETH  . High cholesterol   . Hypertension    CONTROLLED ON MEDS  . Legally blind    WEARS CONTACTS  . Plantar fasciitis of right foot 07.24.14   WITH LATERAL COMPENSATORY SYNDROME  . Seasonal allergies   . Sesamoiditis 08.21.2014    HALLUX RIGHT AND HALLUX INTERPHALANGEUM  . Tachycardia    ON MEDS/ DR Milta DeitersS K[facility]AN   . Vertigo    HX OF THREE YEARS AGO  . Wears contact lenses     Past Surgical History:  Procedure Laterality Date  . ABDOMINAL HYSTERECTOMY    . BREAST BIOPSY Left 2013  . ESOPHAGEAL DILATION N/A 07/07/2016   Procedure: ESOPHAGOGASTRODUODENOSCOPY (EGD);  Surgeon: Midge Miniumarren Wohl, MD;  [facility];  Service: Endoscopy;  Laterality: N/A;  . KIDNEY SURGERY Right    REMOVED  . PARTIAL HYSTERECTOMY    . RHINOPLASTY    . TOE SURGERY Right    screw in great toe  . TONSILECTOMY, ADENOIDECTOMY, BILATERAL MYRINGOTOMY AND TUBES    . TUBAL LIGATION      Current Outpatient Prescriptions  Medication Sig Dispense Refill  . alprazolam (XANAX) 2 MG tablet Take 0.5 mg by mouth daily. AM    . Biotin (BIOTIN MAXIMUM STRENGTH) 10 MG TABS Take by mouth. AM (plus keratin 250 mg)    . Cyanocobalamin (VITAMIN B-12 PO) Take by mouth daily.    Marland Kitchen. FIBER PO Take 2 capsules by mouth daily. Skinny fiber    . fluticasone (FLONASE) 50 MCG/ACT nasal spray Place 2 sprays into both nostrils 2 (two) times daily.    Marland Kitchen. lisinopril-hydrochlorothiazide (PRINZIDE,ZESTORETIC) 10-12.5 MG tablet Take 1 tablet by mouth daily.    . Multiple Vitamin (MULTIVITAMIN) tablet Take 1 tablet by mouth daily.    . pantoprazole (PROTONIX) 40 MG tablet Take 1 tablet (40 mg total) by mouth daily. 30 tablet 11  . Sertraline HCl (ZOLOFT PO) Take 50 mg by mouth.     . simvastatin (ZOCOR) 10 MG tablet Take 10 mg by mouth daily. AM    . aspirin EC 81 MG tablet Take 1 tablet (81 mg total) by mouth daily. 30 tablet 0   No current facility-administered medications for this visit.     Allergies as of 09/05/2016 - Review Complete 09/05/2016  Allergen Reaction Noted  . Codeine Other (See Comments) 07/22/2013  . Sulfa antibiotics Hives 07/22/2013    Vitals: BP 122/81 (BP Location: Right Arm, Patient Position: Sitting, Cuff Size: Normal)   Pulse (!) 109   Ht 5\' 8"  (1.727 m)   Wt 181 lb 9.6 oz (82.4 kg)   BMI 27.61 kg/m  Last Weight:  Wt Readings from Last 1 Encounters:  09/05/16 181 lb 9.6 oz (82.4 kg)   Last Height:   Ht Readings from Last 1 Encounters:  09/05/16 5\' 8"  (1.727 m)    Physical exam: Exam: Gen: NAD, conversant, well nourised, well groomed                     CV: RRR, no MRG. No Carotid Bruits. No peripheral  edema, warm, nontender Eyes: Conjunctivae clear without exudates or hemorrhage  Neuro: Detailed Neurologic Exam  Speech:    Speech is normal; fluent and spontaneous with normal comprehension.  Cognition:    The patient is oriented to person, place, and time;     recent and remote memory intact;     language fluent;     normal attention, concentration,  fund of knowledge Cranial Nerves:    The pupils are equal, round, and reactive to light. The fundi are normal and spontaneous venous pulsations are present. Visual fields are full to finger confrontation. Extraocular movements are intact. Trigeminal sensation is intact and the muscles of mastication are normal. The face is symmetric. The palate elevates in the midline. Hearing intact. Voice is normal. Shoulder shrug is normal. The tongue has normal motion without fasciculations.   Coordination:    Normal finger to nose and heel to shin. Normal rapid alternating movements.   Gait:    Heel-toe and tandem gait are normal.   Motor Observation:    No asymmetry, no atrophy, and no involuntary movements noted. Tone:    Normal muscle tone.    Posture:    Posture is normal. normal erect    Strength:    Strength is V/V in the upper and lower limbs.      Sensation: intact to LT     Reflex Exam:  DTR's:    Deep tendon reflexes in the upper and lower extremities are normal bilaterally.   Toes:    The toes are downgoing bilaterally.   Clonus:    Clonus is absent.      Assessment/Plan:  55 year old with reported TIA (left face and arm numbness, resolved) and CT of the head with frontotemporal atrophy.   TIA: needs MRI of the brain to evaluate for stroke, MRA of the head to eval blood vessels for possible embolic causes. She has a cardiologist, asked her to follow up there for echocardiogram as well as carotid dopplers of the neck. She sees a cardiologist and she will follow up with him for these.   Frontotemporal brain atrophy:  MRI of the brain to further evaluate, she has a FHx of dementia  Addednum: ldl 103 09-07-2016, bun/cr 16/1.0910/0.84 cmp nml except slightly elevated ast 55, alt 47, b12 1042, hgba1c 5.8, tsh 1.38  I had a long d/w patient about her recent possible TIA, risk for recurrent stroke/TIAs, personally independently reviewed imaging studies and stroke evaluation results and answered questions.Continue ASA for secondary stroke prevention and maintain strict control of hypertension with blood pressure goal below 130/90, diabetes with hemoglobin A1c goal below 6.5% and lipids with LDL cholesterol goal below 70 mg/dL.I also advised the patient to eat a healthy diet with plenty of whole grains, cereals, fruits and vegetables, exercise regularly and maintain ideal body weight .  CC: Dr. Roderic PalauKhan  Olajuwon Fosdick, MD  [facility] [address] [place], [place] [id number]  Phone [phone] Fax [phone]

## 2016-09-05 NOTE — Patient Instructions (Addendum)
Overall you are doing very well but I do want to suggest a few things today:   Remember to drink plenty of fluid, eat healthy meals and do not skip any meals. Try to eat protein with a every meal and eat a healthy snack such as fruit or nuts in between meals. Try to keep a regular sleep-wake schedule and try to exercise daily, particularly in the form of walking, 20-30 minutes a day, if you can.   As far as your medications are concerned, I would like to suggest  As far as diagnostic testing: MRI brain and MRA head  Recommend: Follow up with cardiology for echocardiogram and carotid doppler studies due to TIA  maintain strict control of hypertension with blood pressure goal below 130/90, diabetes with hemoglobin A1c goal below 6.5% and lipids with LDL cholesterol goal below 70 mg/dL.  Daily baby aspirin  I would like to see you back as needed, sooner if we need to. Please call us with any [place] questions, concerns, problems, updates or refill requests.   Our phone number is [phone]. We also have an after hours call service for urgent matters and there is a physician on-call for urgent questions. For any emergencies you know to call 911 or go to the nearest emergency room

## 2016-09-13 ENCOUNTER — Encounter: Payer: Self-pay | Admitting: *Deleted

## 2016-09-13 NOTE — Progress Notes (Signed)
Received lab results from [facility] done by [facility]. Labs collected on 09/07/16. Included: lipid panel, CMP, TSH, Vit B12, Hemoglobin A1C.

## 2016-09-17 ENCOUNTER — Ambulatory Visit
Admission: RE | Admit: 2016-09-17 | Discharge: 2016-09-17 | Disposition: A | Discharge status: Home / Self Care | Payer: BLUE CROSS/BLUE SHIELD | Source: Ambulatory Visit | Attending: Neurology | Admitting: Neurology

## 2016-09-17 DIAGNOSIS — R202 Paresthesia of skin: Secondary | ICD-10-CM

## 2016-09-17 DIAGNOSIS — G458 Other transient cerebral ischemic attacks and related syndromes: Secondary | ICD-10-CM

## 2016-09-17 DIAGNOSIS — R4781 Slurred speech: Secondary | ICD-10-CM | POA: Diagnosis not present

## 2016-09-17 DIAGNOSIS — R413 Other amnesia: Secondary | ICD-10-CM

## 2016-09-20 ENCOUNTER — Telehealth: Payer: Self-pay | Admitting: *Deleted

## 2016-09-20 DIAGNOSIS — R413 Other amnesia: Secondary | ICD-10-CM

## 2016-09-20 NOTE — Telephone Encounter (Signed)
-----   Message from Anson FretAntonia B Ahern, MD sent at 09/20/2016  6:02 PM EST ----- The MRI did notice "mild atrophy" but it is unclear if this is significant. The blood vessels looked fine except one was narrowed which may be due to her history of high cholesterol and diabetes. She should follow with her primary care for close management of these vascular risk factors. As far as the atrophy goes, this does not mean she has dementia, we often see atrophy in the brain and there is no way to tell if this is new or if she has always had mild volume loss since we don't have old brain imaging. Given her memory complaints however I would like to send her to formal memory testing her in [place] with a specialist in dementia. Please send me a phone note after speaking with her and let me know if she agrees. I am happy to show her the images and talk with her in person if she want to come into the office.

## 2016-09-20 NOTE — Telephone Encounter (Signed)
Left message for a return call

## 2016-09-21 NOTE — Addendum Note (Signed)
Addended by: Lindell SparKIRKMAN, MICHELLE C on: 09/21/2016 11:40 AM   Modules accepted: Orders

## 2016-09-21 NOTE — Telephone Encounter (Signed)
Dr. Lucia GaskinsAhern has discussed these results with the patient.  She would like a referral sent to Dr. Elvis CoilMaryBeth Bailar at [facility] for formal neuropsychological test.  Orders placed in Epic.

## 2016-10-06 ENCOUNTER — Other Ambulatory Visit: Payer: Self-pay | Admitting: Neurology

## 2016-11-10 ENCOUNTER — Encounter: Payer: BLUE CROSS/BLUE SHIELD | Admitting: Psychology

## 2016-12-08 ENCOUNTER — Encounter: Payer: BLUE CROSS/BLUE SHIELD | Admitting: Psychology

## 2017-01-17 ENCOUNTER — Encounter: Payer: BLUE CROSS/BLUE SHIELD | Admitting: Psychology

## 2017-05-01 ENCOUNTER — Other Ambulatory Visit: Payer: Self-pay

## 2017-05-29 ENCOUNTER — Ambulatory Visit: Payer: BLUE CROSS/BLUE SHIELD | Admitting: Urology

## 2017-05-29 ENCOUNTER — Encounter: Payer: Self-pay | Admitting: Urology

## 2017-06-03 ENCOUNTER — Other Ambulatory Visit: Payer: Self-pay | Admitting: Gastroenterology

## 2017-06-14 ENCOUNTER — Other Ambulatory Visit: Payer: Self-pay

## 2017-09-05 ENCOUNTER — Ambulatory Visit: Payer: BLUE CROSS/BLUE SHIELD | Admitting: Neurology

## 2017-11-08 ENCOUNTER — Other Ambulatory Visit: Payer: Self-pay | Admitting: Nurse Practitioner

## 2017-11-08 DIAGNOSIS — Z1231 Encounter for screening mammogram for malignant neoplasm of breast: Secondary | ICD-10-CM

## 2017-12-08 ENCOUNTER — Other Ambulatory Visit: Payer: Self-pay

## 2017-12-08 MED ORDER — PANTOPRAZOLE SODIUM 40 MG PO TBEC: 40.0000 mg | DELAYED_RELEASE_TABLET | Freq: Every day | ORAL | 1 refills | Status: DC

## 2018-04-12 ENCOUNTER — Encounter: Payer: Self-pay | Admitting: *Deleted

## 2018-05-07 DIAGNOSIS — G4733 Obstructive sleep apnea (adult) (pediatric): Secondary | ICD-10-CM | POA: Insufficient documentation

## 2018-08-12 ENCOUNTER — Other Ambulatory Visit: Payer: Self-pay | Admitting: Gastroenterology

## 2018-09-07 ENCOUNTER — Other Ambulatory Visit: Payer: Self-pay | Admitting: Nurse Practitioner

## 2018-09-07 DIAGNOSIS — Z1231 Encounter for screening mammogram for malignant neoplasm of breast: Secondary | ICD-10-CM

## 2018-10-15 ENCOUNTER — Ambulatory Visit: Payer: BLUE CROSS/BLUE SHIELD | Admitting: Gastroenterology

## 2018-10-15 ENCOUNTER — Other Ambulatory Visit: Payer: Self-pay

## 2018-11-08 ENCOUNTER — Ambulatory Visit: Payer: BLUE CROSS/BLUE SHIELD | Admitting: Gastroenterology

## 2018-12-24 ENCOUNTER — Ambulatory Visit: Payer: BLUE CROSS/BLUE SHIELD | Admitting: Gastroenterology

## 2018-12-24 NOTE — Progress Notes (Deleted)
Primary Care Physician: [name], MD  Primary Gastroenterologist:  Dr. Midge Minium  No chief complaint on file.   HPI: Gabriella Young is a 58 y.o. female here Who has a history of abnormal liver enzymes dating back to March 2018 with an AST of 52 ALT of 47.  Three  months later the patient had the AST and returned to normal at 39 with the ALT of 47. In January 2019 the AST went back up to 56 with the ALT of 55. The patient has had esophageal dilation for dysphagia and there is a referral sent in patient to be seen back in November of last year. There have been no shows and cancellations resulting in the patient not being seen until now.  Current Outpatient Medications  Medication Sig Dispense Refill  . alprazolam (XANAX) 2 MG tablet Take 0.5 mg by mouth daily. AM    . aspirin EC 81 MG tablet Take 1 tablet (81 mg total) by mouth daily. 30 tablet 0  . Biotin (BIOTIN MAXIMUM STRENGTH) 10 MG TABS Take by mouth. AM (plus keratin 250 mg)    . Cyanocobalamin (VITAMIN B-12 PO) Take by mouth daily.    Marland Kitchen FIBER PO Take 2 capsules by mouth daily. Skinny fiber    . fluticasone (FLONASE) 50 MCG/ACT nasal spray Place 2 sprays into both nostrils 2 (two) times daily.    . hydrOXYzine (ATARAX/VISTARIL) 25 MG tablet hydroxyzine HCl 25 mg tablet    . lisinopril-hydrochlorothiazide (PRINZIDE,ZESTORETIC) 10-12.5 MG tablet Take 1 tablet by mouth daily.    . Multiple Vitamin (MULTIVITAMIN) tablet Take 1 tablet by mouth daily.    . Multiple Vitamins-Minerals (WOMENS MULTIVITAMIN PLUS) TABS Womens Multivitamin Plus Oral Tablet QTY: 0 tablet Days: 0 Refills: 0  Written: 04/03/15 Patient Instructions:    . ofloxacin (OCUFLOX) 0.3 % ophthalmic solution ofloxacin 0.3 % eye drops    . ondansetron (ZOFRAN) 4 MG tablet ondansetron HCl 4 mg tablet    . pantoprazole (PROTONIX) 40 MG tablet Take 1 tablet (40 mg total) by mouth daily. 90 tablet 1  . phentermine (ADIPEX-P) 37.5 MG tablet phentermine 37.5 mg tablet  TAKE 1 TABLET BY MOUTH EVERY DAY    . Sertraline HCl (ZOLOFT PO) Take 50 mg by mouth.     . simvastatin (ZOCOR) 10 MG tablet Take 10 mg by mouth daily. AM    . triamcinolone cream (KENALOG) 0.1 % triamcinolone acetonide 0.1 % topical cream    . Vitamin D, Ergocalciferol, (DRISDOL) 1.25 MG (50000 UT) CAPS capsule Take by mouth.     No current facility-administered medications for this visit.     Allergies as of 12/24/2018 - Review Complete 09/05/2016  Allergen Reaction Noted  . Codeine Other (See Comments) 07/22/2013  . Sulfa antibiotics Hives 07/22/2013  . Sulfasalazine Hives 07/22/2013    ROS:  General: Negative for anorexia, weight loss, fever, chills, fatigue, weakness. ENT: Negative for hoarseness, difficulty swallowing , nasal congestion. CV: Negative for chest pain, angina, palpitations, dyspnea on exertion, peripheral edema.  Respiratory: Negative for dyspnea at rest, dyspnea on exertion, cough, sputum, wheezing.  GI: See history of present illness. GU:  Negative for dysuria, hematuria, urinary incontinence, urinary frequency, nocturnal urination.  Endo: Negative for unusual weight change.    Physical Examination:   There were no vitals taken for this visit.  General: Well-nourished, well-developed in no acute distress.  Eyes: No icterus. Conjunctivae pink. Mouth: Oropharyngeal mucosa moist and pink , no lesions erythema or  exudate. Lungs: Clear to auscultation bilaterally. Non-labored. Heart: Regular rate and rhythm, no murmurs rubs or gallops.  Abdomen: Bowel sounds are normal, nontender, nondistended, no hepatosplenomegaly or masses, no abdominal bruits or hernia , no rebound or guarding.   Extremities: No lower extremity edema. No clubbing or deformities. Neuro: Alert and oriented x 3.  Grossly intact. Skin: Warm and dry, no jaundice.   Psych: Alert and cooperative, normal mood and affect.  Labs:    Imaging Studies: No results found.  Assessment and Plan:    Gabriella Young is a 58 y.o. y/o female ***    Midge Minium, MD. [facility]   Note: This dictation was prepared with Dragon dictation along with smaller phrase technology. Any transcriptional errors that result from this process are unintentional.

## 2019-01-07 ENCOUNTER — Ambulatory Visit: Payer: BLUE CROSS/BLUE SHIELD | Admitting: Gastroenterology

## 2019-01-12 ENCOUNTER — Other Ambulatory Visit: Payer: Self-pay | Admitting: Gastroenterology

## 2019-01-16 ENCOUNTER — Ambulatory Visit: Payer: BLUE CROSS/BLUE SHIELD | Admitting: Gastroenterology

## 2019-01-17 ENCOUNTER — Other Ambulatory Visit: Payer: Self-pay | Admitting: *Deleted

## 2019-01-17 DIAGNOSIS — R6889 Other general symptoms and signs: Secondary | ICD-10-CM

## 2019-01-22 LAB — NOVEL CORONAVIRUS, NAA: SARS-COV-2, NAA: NOT DETECTED

## 2019-01-28 ENCOUNTER — Telehealth: Payer: BLUE CROSS/BLUE SHIELD | Admitting: Gastroenterology

## 2019-01-30 ENCOUNTER — Ambulatory Visit (INDEPENDENT_AMBULATORY_CARE_PROVIDER_SITE_OTHER): Payer: BLUE CROSS/BLUE SHIELD | Admitting: Gastroenterology

## 2019-01-30 ENCOUNTER — Other Ambulatory Visit: Payer: Self-pay

## 2019-01-30 ENCOUNTER — Encounter: Payer: Self-pay | Admitting: Nurse Practitioner

## 2019-01-30 ENCOUNTER — Encounter: Payer: Self-pay | Admitting: Gastroenterology

## 2019-01-30 DIAGNOSIS — K219 Gastro-esophageal reflux disease without esophagitis: Secondary | ICD-10-CM

## 2019-01-30 MED ORDER — PANTOPRAZOLE SODIUM 40 MG PO TBEC: 40.0000 mg | DELAYED_RELEASE_TABLET | Freq: Every day | ORAL | 3 refills | Status: DC

## 2019-01-30 NOTE — Progress Notes (Signed)
Gabriella Minium, MD [address]  [address]  [place], [place] [address]  Main: [phone]  Fax: [phone]    Gastroenterology Virtual/Tele Visit  Referring Provider:     Fayrene Helper, NP Primary Care Physician:  Gabriella Loveless, MD Primary Gastroenterologist:  Gabriella Young Servando Snare Reason for Consultation:     Medication refill and abnormal liver enzymes        HPI:     Virtual Visit via Video Note Location of the patient: Home Location of provider: Office Participating persons: Patient myself and Gabriella Young  I connected with Gabriella Young on 01/30/19 at 11:30 AM EDT by a video enabled telemedicine application and verified that I am speaking with the correct person using two identifiers.   I discussed the limitations of evaluation and management by telemedicine and the availability of in person appointments. The patient expressed understanding and agreed to proceed.  History of Present Illness: Gabriella Young is a 58 y.o. female referred by Dr. [name], MD  for consultation & management of medication refill and abnormal liver enzymes.  The patient reports that she has recently had labs done but I do not have access to those labs.  She also states that she called her office but did not get the results because the office was closed.  The patient had liver enzymes that were abnormal approximately a year ago.  She denies any alcohol abuse.  She also reports that she recently had the flu and had some trouble swallowing during that time but it resolved when the flu stopped.  The patient is well controlled on her Protonix 40 mg a day.  She states that she is out of it except for a 30-day supply that she was recently given pending a visitation with me.  There is no report of any black stools or bloody stools.  She denies any nausea or vomiting.  She does report that she had some weight loss with the flulike symptoms but that has stopped.  There is no report of any  dysphasia.  Past Medical History:  Diagnosis Date   Anemia    HX OF/ RESOLVED AFTER HYSTERECTOMY   Arthritis    HAND,FEET,KNEES   Bunion right   08.21.2014, surgery scheduled 9.26.14   Bursitis 08.21.14   plantar aspect of hallux right   Congenital absence of one kidney    RIGHT KIDNEY NEVER DEVELOPED, REMOVED AT 58 YEARS OLD   Dental crowns present    VENEERS ON FRONT UPPER TEETH   High cholesterol    Hypertension    CONTROLLED ON MEDS   Legally blind    WEARS CONTACTS   Plantar fasciitis of right foot 07.24.14   WITH LATERAL COMPENSATORY SYNDROME   Seasonal allergies    Sesamoiditis 08.21.2014    HALLUX RIGHT AND HALLUX INTERPHALANGEUM   Tachycardia    ON MEDS/ DR Gabriella Young    Vertigo    HX OF THREE YEARS AGO   Wears contact lenses     Past Surgical History:  Procedure Laterality Date   ABDOMINAL HYSTERECTOMY     BREAST BIOPSY Left 2013   ESOPHAGEAL DILATION N/A 07/07/2016   Procedure: ESOPHAGOGASTRODUODENOSCOPY (EGD);  Surgeon: Gabriella Minium, MD;  Location: [facility];  Service: Endoscopy;  Laterality: N/A;   KIDNEY SURGERY Right    REMOVED   PARTIAL HYSTERECTOMY     RHINOPLASTY     TOE SURGERY Right    screw in great toe  TONSILECTOMY, ADENOIDECTOMY, BILATERAL MYRINGOTOMY AND TUBES     TUBAL LIGATION      Prior to Admission medications   Medication Sig Start Date End Date Taking? Authorizing Provider  alprazolam Prudy Feeler) 2 MG tablet Take 0.5 mg by mouth daily. AM    [provider]  aspirin EC 81 MG tablet Take 1 tablet (81 mg total) by mouth daily. 09/05/16   Gabriella Fret, MD  Biotin (BIOTIN MAXIMUM STRENGTH) 10 MG TABS Take by mouth. AM (plus keratin 250 mg)    [provider]  Cyanocobalamin (VITAMIN B-12 PO) Take by mouth daily.    [provider]  FIBER PO Take 2 capsules by mouth daily. Skinny fiber    [provider]  fluticasone (FLONASE) 50 MCG/ACT nasal spray Place 2 sprays into both  nostrils 2 (two) times daily.    [provider]  hydrOXYzine (ATARAX/VISTARIL) 25 MG tablet hydroxyzine HCl 25 mg tablet    [provider]  lisinopril-hydrochlorothiazide (PRINZIDE,ZESTORETIC) 10-12.5 MG tablet Take 1 tablet by mouth daily.    [provider]  Multiple Vitamin (MULTIVITAMIN) tablet Take 1 tablet by mouth daily.    [provider]  Multiple Vitamins-Minerals (WOMENS MULTIVITAMIN PLUS) TABS Womens Multivitamin Plus Oral Tablet QTY: 0 tablet Days: 0 Refills: 0  Written: 04/03/15 Patient Instructions: 04/03/15   [provider]  ofloxacin (OCUFLOX) 0.3 % ophthalmic solution ofloxacin 0.3 % eye drops    [provider]  ondansetron (ZOFRAN) 4 MG tablet ondansetron HCl 4 mg tablet    [provider]  pantoprazole (PROTONIX) 40 MG tablet Take 1 tablet (40 mg total) by mouth daily. 01/30/19   Gabriella Minium, MD  phentermine (ADIPEX-P) 37.5 MG tablet phentermine 37.5 mg tablet  TAKE 1 TABLET BY MOUTH EVERY DAY    [provider]  Sertraline HCl (ZOLOFT PO) Take 50 mg by mouth.     [provider]  simvastatin (ZOCOR) 10 MG tablet Take 10 mg by mouth daily. AM    [provider]  triamcinolone cream (KENALOG) 0.1 % triamcinolone acetonide 0.1 % topical cream    [provider]  Vitamin D, Ergocalciferol, (DRISDOL) 1.25 MG (50000 UT) CAPS capsule Take by mouth.    [provider]    Family History  Problem Relation Age of Onset   Heart attack Father    Dementia Mother    Breast cancer Paternal Aunt 22   Dementia Maternal Grandmother    Stroke Maternal Grandmother      Social History   Tobacco Use   Smoking status: Never Smoker   Smokeless tobacco: Never Used  Substance Use Topics   Alcohol use: No   Drug use: No    Allergies as of 01/30/2019 - Review Complete 09/05/2016  Allergen Reaction Noted   Codeine Other (See Comments) 07/22/2013   Sulfa antibiotics  Hives 07/22/2013   Sulfasalazine Hives 07/22/2013    Review of Systems:    All systems reviewed and negative except where noted in HPI.   Observations/Objective:  Labs: CBC    Component Value Date/Time   HGB 13.8 12/23/2011 1015   CMP  No results found for: NA, K, CL, CO2, GLUCOSE, BUN, CREATININE, CALCIUM, PROT, ALBUMIN, AST, ALT, ALKPHOS, BILITOT, GFRNONAA, GFRAA  Imaging Studies: No results found.  Assessment and Plan:   Assessment and Plan: Gabriella Young is a 58 y.o. y/o female is here for follow-up of heartburn.  The patient states she is well controlled on the medication  and needs a refill of the medication.  The patient was sent over with a referral for abnormal liver enzymes although the only labs that was sent to me were back from 2018.  The patient denies any alcohol abuse fevers chills nausea vomiting dysphasia black stools or bloody stools.  She has no worry symptoms at the present time and does not need a repeat EGD at this time.  The patient is well controlled with her medication and will have the medication refilled.  The patient has also been told to fax [place] the labs when she obtains them so that we can evaluate if she still has abnormal liver enzymes and what further work-up needs to be done.  The patient has been explained the plan and agrees with it.    Follow Up Instructions:  I discussed the assessment and treatment plan with the patient. The patient was provided an opportunity to ask questions and all were answered. The patient agreed with the plan and demonstrated an understanding of the instructions.   The patient was advised to call back or seek an in-person evaluation if the symptoms worsen or if the condition fails to improve as anticipated.  I provided 11 minutes of non-face-to-face time during this encounter.   Gabriella Minium, MD  Speech recognition software was used to dictate the above note.

## 2019-02-14 ENCOUNTER — Telehealth: Payer: Self-pay

## 2019-02-14 NOTE — Telephone Encounter (Signed)
-----   Message from Midge Minium, MD sent at 02/07/2019  5:01 PM EDT ----- Thank you for getting her labs.  She will need a workup to see why the liver enzymes are elevated including ANA, SMA, AMA, Ceruloplasmin,Alpha-1 antitrypsin, hepatitis C antibody, hepatitis B surface antigen and antibody, total hepatitis A, Iron studies, repeat liver enzymes and a ultrasound of the right upper quadrant. ----- Message ----- From: Rayann Heman, CMA Sent: 02/06/2019   9:12 AM EDT To: Midge Minium, MD  Pt's labs have been received from Dr. Milta Deiters office showing the elevated liver enzymes.   ----- Message ----- From: Rayann Heman, CMA Sent: 02/06/2019 To: Rayann Heman, CMA  Check to see if we have received liver enzymes report from Dr. Milta Deiters office.

## 2019-02-14 NOTE — Telephone Encounter (Signed)
Left vm for pt to return my call to discuss additional labs per office visit.

## 2019-02-19 ENCOUNTER — Other Ambulatory Visit: Payer: Self-pay

## 2019-02-19 DIAGNOSIS — R748 Abnormal levels of other serum enzymes: Secondary | ICD-10-CM

## 2019-02-19 NOTE — Telephone Encounter (Signed)
Left vm again for pt to return my call. Advised pt we had received lab results from Dr. Milta Deiters office and Dr. Servando Snare has requested additional labs. Labs ordered.

## 2019-02-28 ENCOUNTER — Telehealth: Payer: Self-pay | Admitting: Gastroenterology

## 2019-02-28 ENCOUNTER — Other Ambulatory Visit: Payer: Self-pay

## 2019-02-28 DIAGNOSIS — R748 Abnormal levels of other serum enzymes: Secondary | ICD-10-CM

## 2019-02-28 NOTE — Telephone Encounter (Signed)
Patient LVM that her orders are not in for [facility] and to please call her.

## 2019-02-28 NOTE — Telephone Encounter (Signed)
Pt notified lab order has been released.

## 2019-03-28 LAB — HEPATITIS C ANTIBODY: Hep C Virus Ab: <0.1 s/co ratio (ref 0.0–0.9)

## 2019-03-28 LAB — HEPATIC FUNCTION PANEL
ALT: 74 IU/L — ABNORMAL HIGH (ref 0–32)
AST: 102 IU/L — ABNORMAL HIGH (ref 0–40)
Albumin: 4.5 g/dL (ref 3.8–4.9)
Alkaline Phosphatase: 124 IU/L — ABNORMAL HIGH (ref 39–117)
Bilirubin Total: 0.9 mg/dL (ref 0.0–1.2)
Bilirubin, Direct: 0.18 mg/dL (ref 0.00–0.40)
Total Protein: 7.3 g/dL (ref 6.0–8.5)

## 2019-03-28 LAB — FERRITIN: Ferritin: 120 ng/mL (ref 15–150)

## 2019-03-28 LAB — IRON AND TIBC
Iron Saturation: 30 % (ref 15–55)
Iron: 98 ug/dL (ref 27–159)
Total Iron Binding Capacity: 331 ug/dL (ref 250–450)
UIBC: 233 ug/dL (ref 131–425)

## 2019-03-28 LAB — ANA: Anti Nuclear Antibody (ANA): NEGATIVE

## 2019-03-28 LAB — ALPHA-1-ANTITRYPSIN: A-1 Antitrypsin: 161 mg/dL (ref 101–187)

## 2019-03-28 LAB — MITOCHONDRIAL ANTIBODIES: Mitochondrial Ab: <20 Units (ref 0.0–20.0)

## 2019-03-28 LAB — HEPATITIS B SURFACE ANTIGEN: Hepatitis B Surface Ag: NEGATIVE

## 2019-03-28 LAB — HEPATITIS A ANTIBODY, TOTAL: hep A Total Ab: NEGATIVE

## 2019-03-28 LAB — CERULOPLASMIN: Ceruloplasmin: 25.3 mg/dL (ref 19.0–39.0)

## 2019-03-28 LAB — HEPATITIS B SURFACE ANTIBODY,QUALITATIVE: Hep B Surface Ab, Qual: REACTIVE

## 2019-03-28 LAB — ANTI-SMOOTH MUSCLE ANTIBODY, IGG: Smooth Muscle Ab: 13 Units (ref 0–19)

## 2019-05-13 ENCOUNTER — Telehealth: Payer: Self-pay | Admitting: *Deleted

## 2019-05-13 DIAGNOSIS — Z20822 Contact with and (suspected) exposure to covid-19: Secondary | ICD-10-CM

## 2019-05-13 NOTE — Telephone Encounter (Signed)
Caryl Pina calling from [employer]  calling to request COVID-19 testing for a pt that is symptomatic. Testing referred by Kerry Dory. Pt can be contacted at [phone].   [facility] Phone: [phone] Fax: [phone]   Pt called and scheduled for testing on 05/14/19 at [facility] location. Pt advised to wear a mask and remain in the car at the time of scheduled appt. Understanding verbalized.

## 2019-05-14 ENCOUNTER — Other Ambulatory Visit: Payer: BLUE CROSS/BLUE SHIELD

## 2019-05-14 DIAGNOSIS — Z20822 Contact with and (suspected) exposure to covid-19: Secondary | ICD-10-CM

## 2019-05-19 LAB — NOVEL CORONAVIRUS, NAA: SARS-CoV-2, NAA: NOT DETECTED

## 2019-06-17 ENCOUNTER — Encounter: Payer: Self-pay | Admitting: Nurse Practitioner

## 2019-06-17 ENCOUNTER — Telehealth: Payer: Self-pay | Admitting: Gastroenterology

## 2019-06-17 NOTE — Telephone Encounter (Signed)
Pt left vm for Ginger to discuss test results she has received please call pt

## 2019-06-18 ENCOUNTER — Other Ambulatory Visit: Payer: Self-pay

## 2019-06-18 DIAGNOSIS — R748 Abnormal levels of other serum enzymes: Secondary | ICD-10-CM

## 2019-06-18 NOTE — Telephone Encounter (Signed)
Pt scheduled for a RUQ abdominal US at [facility] on Monday, August 24th at 8:45am. Pt has been instructed to arrive at 8:30am and to be NPO after midnight.

## 2019-06-24 ENCOUNTER — Other Ambulatory Visit: Payer: Self-pay

## 2019-06-24 ENCOUNTER — Ambulatory Visit
Admission: RE | Admit: 2019-06-24 | Discharge: 2019-06-24 | Disposition: A | Discharge status: Home / Self Care | Payer: BLUE CROSS/BLUE SHIELD | Source: Ambulatory Visit | Attending: Gastroenterology | Admitting: Gastroenterology

## 2019-06-24 DIAGNOSIS — R748 Abnormal levels of other serum enzymes: Secondary | ICD-10-CM | POA: Diagnosis present

## 2019-07-03 ENCOUNTER — Telehealth: Payer: Self-pay | Admitting: Gastroenterology

## 2019-07-03 NOTE — Telephone Encounter (Signed)
Patient would like her results for her u/s on  06-24-19.Please advise the next step.

## 2019-07-04 NOTE — Telephone Encounter (Signed)
The patient has not been called with her results because appears to be only 12 tests resulted out of the 23 I ordered.  Please check the progress of these.

## 2019-07-10 NOTE — Telephone Encounter (Signed)
Looks like we had reorder those labs which is why it is showing 23 labs. Please review the 12 results and advise.

## 2019-07-11 ENCOUNTER — Telehealth: Payer: Self-pay

## 2019-07-11 NOTE — Telephone Encounter (Signed)
-----   Message from Lucilla Lame, MD sent at 07/10/2019  1:32 PM EDT ----- Let the patient know the U/S showed fatty liver and 12 of the 23 labs are back showing she is immune to Hep B but will need a Hep A vaccine. The patient should avoid NSAID and any alcohol  And recheck in 2 months. If still high then she will need a liver biopsy.

## 2019-07-11 NOTE — Telephone Encounter (Signed)
Left vm with [place] results and labs. Requested a return call to discuss.

## 2019-09-25 ENCOUNTER — Other Ambulatory Visit: Payer: Self-pay

## 2019-09-25 DIAGNOSIS — Z20822 Contact with and (suspected) exposure to covid-19: Secondary | ICD-10-CM

## 2019-09-26 LAB — NOVEL CORONAVIRUS, NAA: SARS-CoV-2, NAA: NOT DETECTED

## 2019-10-07 ENCOUNTER — Encounter: Payer: Self-pay | Admitting: Nurse Practitioner

## 2019-10-10 ENCOUNTER — Other Ambulatory Visit: Payer: Self-pay

## 2019-10-15 ENCOUNTER — Other Ambulatory Visit: Payer: Self-pay

## 2019-10-15 DIAGNOSIS — Z1211 Encounter for screening for malignant neoplasm of colon: Secondary | ICD-10-CM

## 2019-10-15 DIAGNOSIS — R748 Abnormal levels of other serum enzymes: Secondary | ICD-10-CM

## 2019-10-21 ENCOUNTER — Telehealth: Payer: Self-pay

## 2019-10-21 NOTE — Telephone Encounter (Signed)
Patient is calling because she got a call because her insurance was out of network. Patient states she called the [facility] clinic and they are accepting it till the end of the year and then not accepting insurance anymore. Informed patient that [facility] would be accepting her insurance. Patient states she will call around and find a new GI doctor

## 2019-10-28 ENCOUNTER — Encounter: Admission: RE | Payer: Self-pay | Source: Home / Self Care

## 2019-10-28 ENCOUNTER — Ambulatory Visit
Admission: RE | Admit: 2019-10-28 | Payer: BLUE CROSS/BLUE SHIELD | Source: Home / Self Care | Admitting: Gastroenterology

## 2019-10-28 SURGERY — COLONOSCOPY WITH PROPOFOL
Anesthesia: Choice

## 2020-02-20 ENCOUNTER — Ambulatory Visit: Payer: BLUE CROSS/BLUE SHIELD | Attending: Internal Medicine

## 2020-02-20 DIAGNOSIS — Z23 Encounter for immunization: Secondary | ICD-10-CM

## 2020-02-20 NOTE — Progress Notes (Signed)
   Covid-19 Vaccination Clinic  Name:  Gabriella Young    MRN: [id number] DOB: 09/30/1961  02/20/2020  Gabriella Young was observed post Covid-19 immunization for 15 minutes without incident. She was provided with Vaccine Information Sheet and instruction to access the V-Safe system.   Gabriella Young was instructed to call 911 with any severe reactions post vaccine: Marland Kitchen Difficulty breathing  . Swelling of face and throat  . A fast heartbeat  . A bad rash all over body  . Dizziness and weakness   Immunizations Administered    Name Date Dose VIS Date Route   Pfizer COVID-19 Vaccine 02/20/2020 11:08 AM 0.3 mL 12/25/2018 Intramuscular   Manufacturer: [employer], [employer]   Lot: [id number]   NDC: [address]-8

## 2020-03-17 ENCOUNTER — Ambulatory Visit: Payer: BLUE CROSS/BLUE SHIELD

## 2020-04-04 ENCOUNTER — Ambulatory Visit: Payer: BLUE CROSS/BLUE SHIELD

## 2020-04-07 ENCOUNTER — Ambulatory Visit: Payer: BLUE CROSS/BLUE SHIELD

## 2020-04-07 ENCOUNTER — Ambulatory Visit: Payer: BLUE CROSS/BLUE SHIELD | Attending: Internal Medicine

## 2020-04-07 DIAGNOSIS — Z23 Encounter for immunization: Secondary | ICD-10-CM

## 2020-04-07 NOTE — Progress Notes (Signed)
   Covid-19 Vaccination Clinic  Name:  Gabriella Young    MRN: [id number] DOB: Mar 02, 1961  04/07/2020  Ms. [name] was observed post Covid-19 immunization for 30 minutes based on pre-vaccination screening without incident. She was provided with Vaccine Information Sheet and instruction to access the V-Safe system.   Ms. [name] was instructed to call 911 with any severe reactions post vaccine: Marland Kitchen Difficulty breathing  . Swelling of face and throat  . A fast heartbeat  . A bad rash all over body  . Dizziness and weakness   Immunizations Administered    Name Date Dose VIS Date Route   Pfizer COVID-19 Vaccine 04/07/2020 12:29 PM 0.3 mL 12/25/2018 Intramuscular   Manufacturer: [employer], [employer]   Lot: [id number]   NDC: [address]-8

## 2020-04-30 ENCOUNTER — Other Ambulatory Visit: Payer: Self-pay | Admitting: Nurse Practitioner

## 2020-04-30 DIAGNOSIS — Z1231 Encounter for screening mammogram for malignant neoplasm of breast: Secondary | ICD-10-CM

## 2020-05-06 ENCOUNTER — Ambulatory Visit: Payer: BLUE CROSS/BLUE SHIELD

## 2020-05-11 ENCOUNTER — Other Ambulatory Visit: Payer: Self-pay

## 2020-05-11 ENCOUNTER — Ambulatory Visit: Payer: BLUE CROSS/BLUE SHIELD | Attending: Nurse Practitioner

## 2020-11-03 ENCOUNTER — Other Ambulatory Visit: Payer: Self-pay

## 2020-11-03 ENCOUNTER — Ambulatory Visit
Admission: EM | Admit: 2020-11-03 | Discharge: 2020-11-03 | Disposition: A | Discharge status: Home / Self Care | Payer: 59 | Attending: Family Medicine | Admitting: Family Medicine

## 2020-11-03 DIAGNOSIS — Z20822 Contact with and (suspected) exposure to covid-19: Secondary | ICD-10-CM | POA: Insufficient documentation

## 2020-11-03 DIAGNOSIS — J329 Chronic sinusitis, unspecified: Secondary | ICD-10-CM

## 2020-11-03 MED ORDER — AMOXICILLIN-POT CLAVULANATE 875-125 MG PO TABS: 1.0000 | ORAL_TABLET | Freq: Two times a day (BID) | ORAL | 0 refills | Status: DC

## 2020-11-03 NOTE — ED Provider Notes (Signed)
[facility]    CSN: [id number] Arrival date & time: 11/03/20  1346      History   Chief Complaint Chief Complaint  Patient presents with  . Sore Throat  . Nasal Congestion  . Cough   HPI  60 year old female presents with the above complaints.  Patient reports that she has had ongoing symptoms since 12/25.  She reports cough, congestion.  Has had some postnasal drip.  She is primarily bothered by congestion and productive cough at this time.  Has had recent Covid testing which has been negative.  She states that she has had an elevated temperature recently.  Currently afebrile.  Patient states that she has a history of chronic sinusitis.  No other complaints concerns at this time  Past Medical History:  Diagnosis Date  . Anemia    HX OF/ RESOLVED AFTER HYSTERECTOMY  . Arthritis    HAND,FEET,KNEES  . Bunion right   08.21.2014, surgery scheduled 9.26.14  . Bursitis 08.21.14   plantar aspect of hallux right  . Congenital absence of one kidney    RIGHT KIDNEY NEVER DEVELOPED, REMOVED AT 60 YEARS OLD  . Dental crowns present    VENEERS ON FRONT UPPER TEETH  . High cholesterol   . Hypertension    CONTROLLED ON MEDS  . Legally blind    WEARS CONTACTS  . Plantar fasciitis of right foot 07.24.14   WITH LATERAL COMPENSATORY SYNDROME  . Seasonal allergies   . Sesamoiditis 08.21.2014    HALLUX RIGHT AND HALLUX INTERPHALANGEUM  . Tachycardia    ON MEDS/ DR Milta Deiters   . Vertigo    HX OF THREE YEARS AGO  . Wears contact lenses     Patient Active Problem List   Diagnosis Date Noted  . Problems with swallowing and mastication   . Stricture and stenosis of esophagus   . Chest pain 03/06/2015  . Hallux valgus (acquired) 08/08/2013  . Mallet toe of right foot 08/08/2013  . Plantar fasciitis of right foot 07/22/2013  . Bunion   . Bursitis   . Sesamoiditis   . Benign essential hypertension 12/02/2011  . Hyperlipidemia 12/02/2011  . Adjustment disorder with anxiety  07/20/2010    Past Surgical History:  Procedure Laterality Date  . ABDOMINAL HYSTERECTOMY    . BREAST BIOPSY Left 2013  . ESOPHAGEAL DILATION N/A 07/07/2016   Procedure: ESOPHAGOGASTRODUODENOSCOPY (EGD);  Surgeon: Midge Minium, MD;  Location: [facility];  Service: Endoscopy;  Laterality: N/A;  . KIDNEY SURGERY Right    REMOVED  . PARTIAL HYSTERECTOMY    . RHINOPLASTY    . TOE SURGERY Right    screw in great toe  . TONSILECTOMY, ADENOIDECTOMY, BILATERAL MYRINGOTOMY AND TUBES    . TUBAL LIGATION      OB History   No obstetric history on file.      Home Medications    Prior to Admission medications   Medication Sig Start Date End Date Taking? Authorizing Provider  amoxicillin-clavulanate (AUGMENTIN) 875-125 MG tablet Take 1 tablet by mouth 2 (two) times daily. 11/03/20  Yes Gwenda Heiner G, DO  alprazolam Prudy Feeler) 2 MG tablet Take 0.5 mg by mouth daily. AM    [provider]  aspirin EC 81 MG tablet Take 1 tablet (81 mg total) by mouth daily. 09/05/16   Anson Fret, MD  Biotin (BIOTIN MAXIMUM STRENGTH) 10 MG TABS Take by mouth. AM (plus keratin 250 mg)    [provider]  Cyanocobalamin (VITAMIN B-12  PO) Take by mouth daily.    [provider]  FIBER PO Take 2 capsules by mouth daily. Skinny fiber    [provider]  fluticasone (FLONASE) 50 MCG/ACT nasal spray Place 2 sprays into both nostrils 2 (two) times daily.    [provider]  hydrOXYzine (ATARAX/VISTARIL) 25 MG tablet hydroxyzine HCl 25 mg tablet    [provider]  lisinopril-hydrochlorothiazide (PRINZIDE,ZESTORETIC) 10-12.5 MG tablet Take 1 tablet by mouth daily.    [provider]  Multiple Vitamin (MULTIVITAMIN) tablet Take 1 tablet by mouth daily.    [provider]  Multiple Vitamins-Minerals (WOMENS MULTIVITAMIN PLUS) TABS Womens Multivitamin Plus Oral Tablet QTY: 0 tablet Days: 0 Refills: 0  Written: 04/03/15 Patient Instructions: 04/03/15    [provider]  ofloxacin (OCUFLOX) 0.3 % ophthalmic solution ofloxacin 0.3 % eye drops    [provider]  ondansetron (ZOFRAN) 4 MG tablet ondansetron HCl 4 mg tablet    [provider]  pantoprazole (PROTONIX) 40 MG tablet Take 1 tablet (40 mg total) by mouth daily. 01/30/19   Midge Minium, MD  phentermine (ADIPEX-P) 37.5 MG tablet phentermine 37.5 mg tablet  TAKE 1 TABLET BY MOUTH EVERY DAY    [provider]  Sertraline HCl (ZOLOFT PO) Take 50 mg by mouth.     [provider]  simvastatin (ZOCOR) 10 MG tablet Take 10 mg by mouth daily. AM    [provider]  triamcinolone cream (KENALOG) 0.1 % triamcinolone acetonide 0.1 % topical cream    [provider]  Vitamin D, Ergocalciferol, (DRISDOL) 1.25 MG (50000 UT) CAPS capsule Take by mouth.    [provider]    Family History Family History  Problem Relation Age of Onset  . Heart attack Father   . Dementia Mother   . Breast cancer Paternal Aunt 33  . Dementia Maternal Grandmother   . Stroke Maternal Grandmother     Social History Social History   Tobacco Use  . Smoking status: Never Smoker  . Smokeless tobacco: Never Used  Substance Use Topics  . Alcohol use: No  . Drug use: No     Allergies   Codeine, Sulfa antibiotics, and Sulfasalazine   Review of Systems Review of Systems Per HPI  Physical Exam Triage Vital Signs ED Triage Vitals  Enc Vitals Group     BP 11/03/20 1600 127/78     Pulse Rate 11/03/20 1600 96     Resp 11/03/20 1600 18     Temp 11/03/20 1600 98.2 F (36.8 C)     Temp Source 11/03/20 1600 Oral     SpO2 11/03/20 1600 98 %     Weight 11/03/20 1529 180 lb (81.6 kg)     Height --      Head Circumference --      Peak Flow --      Pain Score 11/03/20 1529 0     Pain Loc --      Pain Edu? --      Excl. in GC? --    Updated Vital Signs BP 127/78   Pulse 96   Temp 98.2 F (36.8 C) (Oral)   Resp 18   Wt 81.6 kg   SpO2  98%   BMI 27.37 kg/m   Visual Acuity Right Eye Distance:   Left Eye Distance:   Bilateral Distance:    Right Eye Near:   Left Eye Near:    Bilateral Near:     Physical  Exam Vitals and nursing note reviewed.  Constitutional:      General: She is not in acute distress.    Appearance: Normal appearance. She is not ill-appearing.  HENT:     Head: Normocephalic and atraumatic.     Nose: Congestion present.  Eyes:     Conjunctiva/sclera: Conjunctivae normal.  Cardiovascular:     Rate and Rhythm: Normal rate and regular rhythm.     Heart sounds: No murmur heard.   Pulmonary:     Effort: Pulmonary effort is normal.     Breath sounds: Normal breath sounds. No wheezing, rhonchi or rales.  Neurological:     Mental Status: She is alert.  Psychiatric:        Mood and Affect: Mood normal.        Behavior: Behavior normal.    UC Treatments / Results  Labs (all labs ordered are listed, but only abnormal results are displayed) Labs Reviewed  SARS CORONAVIRUS 2 (TAT 6-24 HRS)    EKG   Radiology No results found.  Procedures Procedures (including critical care time)  Medications Ordered in UC Medications - No data to display  Initial Impression / Assessment and Plan / UC Course  I have reviewed the triage vital signs and the nursing notes.  Pertinent labs & imaging results that were available during my care of the patient were reviewed by me and considered in my medical decision making (see chart for details).    60 year old female presents with chronic sinusitis.  Treating with Augmentin.  Final Clinical Impressions(s) / UC Diagnoses   Final diagnoses:  Chronic sinusitis, unspecified location     Discharge Instructions     Medication as prescribed.  Take care  Dr. Lacinda Axon    ED Prescriptions    Medication Sig Dispense Auth. Provider   amoxicillin-clavulanate (AUGMENTIN) 875-125 MG tablet Take 1 tablet by mouth 2 (two) times daily. 20 tablet Coral Spikes,  DO     PDMP not reviewed this encounter.   Coral Spikes, [place] 11/03/20 2056

## 2020-11-03 NOTE — Discharge Instructions (Signed)
Medication as prescribed.  Take care  Dr. Romayne Ticas  

## 2020-11-03 NOTE — ED Triage Notes (Signed)
Patient presents to Urgent Care with complaints of  productive cough, congestion, chills, since 12/25. Pt had a negative covid test 12/27.  Denies fever.

## 2020-11-04 LAB — SARS CORONAVIRUS 2 (TAT 6-24 HRS): SARS Coronavirus 2: NEGATIVE

## 2020-12-07 ENCOUNTER — Encounter: Payer: 59 | Admitting: Obstetrics and Gynecology

## 2020-12-09 ENCOUNTER — Encounter: Payer: 59 | Admitting: Obstetrics and Gynecology

## 2020-12-14 ENCOUNTER — Ambulatory Visit: Payer: 59 | Admitting: Podiatry

## 2020-12-17 ENCOUNTER — Other Ambulatory Visit (HOSPITAL_COMMUNITY)
Admission: RE | Admit: 2020-12-17 | Discharge: 2020-12-17 | Disposition: A | Discharge status: Home / Self Care | Payer: 59 | Source: Ambulatory Visit | Attending: Obstetrics and Gynecology | Admitting: Obstetrics and Gynecology

## 2020-12-17 ENCOUNTER — Encounter: Payer: Self-pay | Admitting: Obstetrics and Gynecology

## 2020-12-17 ENCOUNTER — Ambulatory Visit (INDEPENDENT_AMBULATORY_CARE_PROVIDER_SITE_OTHER): Payer: 59 | Admitting: Obstetrics and Gynecology

## 2020-12-17 VITALS — BP 115/73 | Ht 68.0 in | Wt 188.0 lb

## 2020-12-17 DIAGNOSIS — Z124 Encounter for screening for malignant neoplasm of cervix: Secondary | ICD-10-CM | POA: Diagnosis not present

## 2020-12-17 DIAGNOSIS — N898 Other specified noninflammatory disorders of vagina: Secondary | ICD-10-CM

## 2020-12-17 NOTE — Progress Notes (Signed)
Obstetrics & Gynecology Office Visit   Chief Complaint  Patient presents with  . Pelvic Pain    Cyst on vagina, has already drained. Per PMD thinks she still need to be seen. No concerns today.    The patient is seen in referral at the request of [name], MD from [facility] for vaginal cyst.   History of Present Illness: 60 y.o. [id number] female who is seen in referral from [name], MD from [facility] for vaginal cyst.  For about a year she has had a vaginal/perineal cyst that is on her right side, just on the inside of the vagina.  She states that she has had a pattern of the cyst filling and then draining. In November the cyst was much bigger than usual and was painful (more than usual).   The cyst drained a brownish-redish color. She tried to apply pressure and did not get it to drain. The next day the cyst did drain.  Prior to about a year ago she never had any sort of cyst before.  She had two vaginal deliveries that were not instrumented.   She also has been noting some dryness in her lower mons area with noted bleeding occasionally when she wipes.  She notes some itching in the area, also.   Past Medical History:  Diagnosis Date  . Anemia    HX OF/ RESOLVED AFTER HYSTERECTOMY  . Arthritis    HAND,FEET,KNEES  . Bunion right   08.21.2014, surgery scheduled 9.26.14  . Bursitis 08.21.14   plantar aspect of hallux right  . Congenital absence of one kidney    RIGHT KIDNEY NEVER DEVELOPED, REMOVED AT 60 YEARS OLD  . Dental crowns present    VENEERS ON FRONT UPPER TEETH  . High cholesterol   . Hypertension    CONTROLLED ON MEDS  . Legally blind    WEARS CONTACTS  . Plantar fasciitis of right foot 07.24.14   WITH LATERAL COMPENSATORY SYNDROME  . Seasonal allergies   . Sesamoiditis 08.21.2014    HALLUX RIGHT AND HALLUX INTERPHALANGEUM  . Tachycardia    ON MEDS/ DR Milta Deiters   . Vertigo    HX OF THREE YEARS AGO  . Wears contact lenses     Past Surgical  History:  Procedure Laterality Date  . ABDOMINAL HYSTERECTOMY    . BREAST BIOPSY Left 2013  . ESOPHAGEAL DILATION N/A 07/07/2016   Procedure: ESOPHAGOGASTRODUODENOSCOPY (EGD);  Surgeon: Midge Minium, MD;  Location: [facility];  Service: Endoscopy;  Laterality: N/A;  . KIDNEY SURGERY Right    REMOVED  . PARTIAL HYSTERECTOMY    . RHINOPLASTY    . TOE SURGERY Right    screw in great toe  . TONSILECTOMY, ADENOIDECTOMY, BILATERAL MYRINGOTOMY AND TUBES    . TUBAL LIGATION      Gynecologic History: No LMP recorded. Patient has had a hysterectomy.  Obstetric History: [id number] Family History  Problem Relation Age of Onset  . Heart attack Father   . Dementia Mother   . Breast cancer Paternal Aunt 87  . Dementia Maternal Grandmother   . Stroke Maternal Grandmother     Social History   Socioeconomic History  . Marital status: Married    Spouse name: [name]  . Number of children: 2  . Years of education: 16+  . Highest education level: Not on file  Occupational History  . Occupation: Unemployed  Tobacco Use  . Smoking status: Never Smoker  . Smokeless tobacco: Never  Used  Vaping Use  . Vaping Use: Never used  Substance and Sexual Activity  . Alcohol use: No  . Drug use: No  . Sexual activity: Not Currently  Other Topics Concern  . Not on file  Social History Narrative   Lives with husband, [name]   Caffeine use: 1 cup coffee 3x/week   Pepsi with meals sometimes    Social Determinants of Health   Financial Resource Strain: Not on file  Food Insecurity: Not on file  Transportation Needs: Not on file  Physical Activity: Not on file  Stress: Not on file  Social Connections: Not on file  Intimate Partner Violence: Not on file    Allergies  Allergen Reactions  . Codeine Other (See Comments)    MAKES DROWSY/ NOT REALLY ALLERGY, DOESN'T LIKE THE FEELING  . Sulfa Antibiotics Hives  . Sulfasalazine Hives    Prior to Admission medications   Medication Sig Start  Date End Date Taking? Authorizing Provider  carvedilol (COREG) 3.125 MG tablet Carvedilol 3.125 MG Oral Tablet QTY: 180 tablet Days: 90 Refills: 1  Written: 08/24/20 Patient Instructions: TAKE 1 TABLET BY MOUTH TWICE A DAY 08/24/20 02/20/21 Yes [provider]  dapagliflozin propanediol (FARXIGA) 10 MG TABS tablet Farxiga 10 MG Oral Tablet QTY: 90 tablet Days: 90 Refills: 1  Written: 09/03/20 Patient Instructions: once a day 09/03/20 03/02/21 Yes [provider]  FIBER PO Take 2 capsules by mouth daily. Skinny fiber   Yes [provider]  lisinopril-hydrochlorothiazide (PRINZIDE,ZESTORETIC) 10-12.5 MG tablet Take 1 tablet by mouth daily.   Yes [provider]  Multiple Vitamin (MULTIVITAMIN) tablet Take 1 tablet by mouth daily.   Yes [provider]  alprazolam Prudy Feeler) 2 MG tablet Take 0.5 mg by mouth daily. AM Patient not taking: Reported on 12/17/2020    [provider]  amoxicillin-clavulanate (AUGMENTIN) 875-125 MG tablet Take 1 tablet by mouth 2 (two) times daily. Patient not taking: Reported on 12/17/2020 11/03/20   Tommie Sams, DO  aspirin EC 81 MG tablet Take 1 tablet (81 mg total) by mouth daily. Patient not taking: Reported on 12/17/2020 09/05/16   Anson Fret, MD  Biotin 10 MG TABS Take by mouth. AM (plus keratin 250 mg) Patient not taking: Reported on 12/17/2020    [provider]  Cyanocobalamin (VITAMIN B-12 PO) Take by mouth daily. Patient not taking: Reported on 12/17/2020    [provider]  fluticasone (FLONASE) 50 MCG/ACT nasal spray Place 2 sprays into both nostrils 2 (two) times daily. Patient not taking: Reported on 12/17/2020    [provider]  hydrOXYzine (ATARAX/VISTARIL) 25 MG tablet hydroxyzine HCl 25 mg tablet Patient not taking: Reported on 12/17/2020    [provider]  Multiple Vitamins-Minerals (WOMENS MULTIVITAMIN PLUS) TABS Womens Multivitamin Plus Oral Tablet QTY: 0 tablet Days:  0 Refills: 0  Written: 04/03/15 Patient Instructions: 04/03/15   [provider]  ofloxacin (OCUFLOX) 0.3 % ophthalmic solution ofloxacin 0.3 % eye drops Patient not taking: Reported on 12/17/2020    [provider]  ondansetron (ZOFRAN) 4 MG tablet ondansetron HCl 4 mg tablet Patient not taking: Reported on 12/17/2020    [provider]  pantoprazole (PROTONIX) 40 MG tablet Take 1 tablet (40 mg total) by mouth daily. Patient not taking: Reported on 12/17/2020 01/30/19   Midge Minium, MD  phentermine (ADIPEX-P) 37.5 MG tablet phentermine 37.5 mg tablet  TAKE 1 TABLET BY MOUTH EVERY DAY Patient not taking: Reported on 12/17/2020  [provider]  Sertraline HCl (ZOLOFT PO) Take 50 mg by mouth.  Patient not taking: Reported on 12/17/2020    [provider]  simvastatin (ZOCOR) 10 MG tablet Take 10 mg by mouth daily. AM Patient not taking: Reported on 12/17/2020    [provider]  triamcinolone cream (KENALOG) 0.1 % triamcinolone acetonide 0.1 % topical cream Patient not taking: Reported on 12/17/2020    [provider]  Vitamin D, Ergocalciferol, (DRISDOL) 1.25 MG (50000 UT) CAPS capsule Take by mouth. Patient not taking: Reported on 12/17/2020    [provider]    Review of Systems  Constitutional: Negative.   HENT: Negative.   Eyes: Negative.   Respiratory: Negative.   Cardiovascular: Negative.   Gastrointestinal: Negative.   Genitourinary: Negative.   Musculoskeletal: Negative.   Skin: Negative.   Neurological: Negative.   Psychiatric/Behavioral: Negative.      Physical Exam BP 115/73   Ht 5\' 8"  (1.727 m)   Wt 188 lb (85.3 kg)   BMI 28.59 kg/m  No LMP recorded. Patient has had a hysterectomy. Physical Exam Constitutional:      General: She is not in acute distress.    Appearance: Normal appearance. She is well-developed.  Genitourinary:     Vulva and urethral meatus normal.     No lesions in the vagina.      Right Labia: rash.     Right Labia: No tenderness, lesions, skin changes or Bartholin's cyst.    Left Labia: rash.     Left Labia: No tenderness, skin changes or Bartholin's cyst.       No inguinal adenopathy present in the right or left side.    Pelvic Tanner Score: 5/5.    No vaginal discharge, erythema, tenderness, bleeding or ulceration.     Vaginal exam comments: Cervix IS present.      Right Adnexa: not tender, not full and no mass present.    Left Adnexa: not tender, not full and no mass present.    No cervical motion tenderness, friability, lesion or polyp.     Uterus is absent.     No urethral tenderness or mass present.     Pelvic exam was performed with patient in the lithotomy position.  HENT:     Head: Normocephalic and atraumatic.  Eyes:     General: No scleral icterus.    Conjunctiva/sclera: Conjunctivae normal.  Cardiovascular:     Rate and Rhythm: Normal rate and regular rhythm.     Heart sounds: No murmur heard. No friction rub. No gallop.   Pulmonary:     Effort: Pulmonary effort is normal. No respiratory distress.     Breath sounds: Normal breath sounds. No wheezing or rales.  Abdominal:     General: Bowel sounds are normal. There is no distension.     Palpations: Abdomen is soft. There is no mass.     Tenderness: There is no abdominal tenderness. There is no guarding or rebound.     Hernia: There is no hernia in the left inguinal area or right inguinal area.  Musculoskeletal:        General: Normal range of motion.     Cervical back: Normal range of motion and neck supple.  Lymphadenopathy:     Lower Body: No right inguinal adenopathy. No left inguinal adenopathy.  Neurological:     General: No focal deficit present.     Mental Status: She is alert and oriented to person, place, and time.  Cranial Nerves: No cranial nerve deficit.  Skin:    General: Skin is warm and dry.     Findings: No erythema.  Psychiatric:        Mood and Affect: Mood  normal.        Behavior: Behavior normal.        Judgment: Judgment normal.     Female chaperone present for pelvic and breast  portions of the physical exam  Assessment: 60 y.o. No obstetric history on file. female here for  1. Vaginal cyst   2. Pap smear for cervical cancer screening      Plan: Problem List Items Addressed This Visit   None   Visit Diagnoses    Vaginal cyst    -  Primary   Pap smear for cervical cancer screening       Relevant Orders   Cytology - PAP     Unable to tell for sure where the cystic lesion starts.  Most likely suspect is a Bartholin gland cyst.  Also on the differential are Skene gland cyst, Gartner duct cyst, inclusion cyst.  No evidence of malignancy.  She was asked to return to office ASAP if the cyst returned.  She does have some vulvar inflammation in the mons area as described above.  I recommended she use a topical emollient on the area for a few weeks to see if this relieved her symptoms.  If it does not, she was instructed to return to clinic for a punch biopsy to see if she has a chronic vulvar inflammatory disease.    She voiced understanding. She does have a cervix. Her hysterectomy was likely a supracervical one.  Pap smear performed today since she did not remember when her last one was performed.    A total of 39 minutes were spent face-to-face with the patient as well as preparation, review, communication, and documentation during this encounter.    Thomasene MohairStephen Kael Keetch, MD 12/17/2020 3:39 PM    CC: Margaretann LovelessKhan, Neelam S, MD [address] [place] [id number]

## 2020-12-21 LAB — CYTOLOGY - PAP
Comment: NEGATIVE
Diagnosis: NEGATIVE
High risk HPV: NEGATIVE

## 2021-08-03 ENCOUNTER — Other Ambulatory Visit: Payer: Self-pay | Admitting: Nurse Practitioner

## 2021-08-03 DIAGNOSIS — Z1231 Encounter for screening mammogram for malignant neoplasm of breast: Secondary | ICD-10-CM

## 2021-08-23 ENCOUNTER — Ambulatory Visit: Payer: 59 | Admitting: *Deleted

## 2021-09-07 ENCOUNTER — Encounter: Payer: Self-pay | Admitting: *Deleted

## 2021-09-07 ENCOUNTER — Other Ambulatory Visit: Payer: Self-pay

## 2021-09-07 ENCOUNTER — Encounter: Payer: 59 | Attending: Nurse Practitioner | Admitting: *Deleted

## 2021-09-07 VITALS — BP 120/62 | Ht 68.0 in | Wt 184.5 lb

## 2021-09-07 DIAGNOSIS — Z713 Dietary counseling and surveillance: Secondary | ICD-10-CM | POA: Diagnosis not present

## 2021-09-07 DIAGNOSIS — E119 Type 2 diabetes mellitus without complications: Secondary | ICD-10-CM | POA: Insufficient documentation

## 2021-09-07 NOTE — Progress Notes (Signed)
Diabetes Self-Management Education  Visit Type: First/Initial  Appt. Start Time: 1030 Appt. End Time: 1150  09/07/2021  [name], identified by name and date of birth, is a 60 y.o. female with a diagnosis of Diabetes: Type 2.   ASSESSMENT  Blood pressure 120/62, height 5\' 8"  (1.727 m), weight 184 lb 8 oz (83.7 kg). Body mass index is 28.05 kg/m.   Diabetes Self-Management Education - 09/07/21 1243       Visit Information   Visit Type First/Initial      Initial Visit   Diabetes Type Type 2    Are you currently following a meal plan? No    Are you taking your medications as prescribed? Yes    Date Diagnosed 4 years ago      Health Coping   How would you rate your overall health? Poor      Psychosocial Assessment   Patient Belief/Attitude about Diabetes Defeat/Burnout    Self-care barriers Other (comment)   primary caregiver for her mother who has dementia   Self-management support Doctor's office;Family    Patient Concerns Nutrition/Meal planning;Medication;Monitoring;Healthy Lifestyle;Problem Solving;Glycemic Control;Weight Control    Special Needs None    Preferred Learning Style Auditory;Visual;Hands on    Learning Readiness Ready    How often do you need to have someone help you when you read instructions, pamphlets, or other written materials from your doctor or pharmacy? 1 - Never    What is the last grade level you completed in school? 2 years college      Pre-Education Assessment   Patient understands the diabetes disease and treatment process. Needs Review    Patient understands incorporating nutritional management into lifestyle. Needs Instruction    Patient undertands incorporating physical activity into lifestyle. Needs Instruction    Patient understands using medications safely. Needs Review    Patient understands monitoring blood glucose, interpreting and using results Needs Review    Patient understands prevention, detection, and treatment of  acute complications. Needs Instruction    Patient understands prevention, detection, and treatment of chronic complications. Needs Review    Patient understands how to develop strategies to address psychosocial issues. Needs Instruction    Patient understands how to develop strategies to promote health/change behavior. Needs Instruction      Complications   Last HgB A1C per patient/outside source 7.7 %   08/11/2021   How often do you check your blood sugar? 0 times/day (not testing)   Pt hasn't checked her blood sugar in over a year. She was wearing the FreeStyle Libre CGM.  BG in the office was 172 mg/dL at 10/11/2021 am - 3 hrs pp.   Have you had a dilated eye exam in the past 12 months? Yes    Have you had a dental exam in the past 12 months? No    Are you checking your feet? Yes    How many days per week are you checking your feet? 4      Dietary Intake   Breakfast eggs, bacon, fruit (strawberries, grapes, cantaloup, watermelon); gravy biscuit    Snack (morning) 0-1 snacks/day - may have yogurt or fruit, cookies    Lunch skips or eats peanut butter crackers    Dinner chicken, beef, fish; potatoes, spaghetti 1 x week, pinto beans, lima beans, corn, broccoli, carrots, green beans    Beverage(s) water, juice, regular soda      Exercise   Exercise Type ADL's      Patient Education   Previous  Diabetes Education Yes (please comment)    Disease state  Factors that contribute to the development of diabetes;Explored patient's options for treatment of their diabetes    Nutrition management  Role of diet in the treatment of diabetes and the relationship between the three main macronutrients and blood glucose level;Food label reading, portion sizes and measuring food.;Carbohydrate counting;Reviewed blood glucose goals for pre and post meals and how to evaluate the patients' food intake on their blood glucose level.    Physical activity and exercise  Role of exercise on diabetes management, blood  pressure control and cardiac health.    Medications Reviewed patients medication for diabetes, action, purpose, timing of dose and side effects.    Monitoring Purpose and frequency of SMBG.;Taught/discussed recording of test results and interpretation of SMBG.;Identified appropriate SMBG and/or A1C goals.    Chronic complications Relationship between chronic complications and blood glucose control    Psychosocial adjustment Role of stress on diabetes;Identified and addressed patients feelings and concerns about diabetes      Individualized Goals (developed by patient)   Reducing Risk Other (comment)   improve blood sugars, decrease medications, prevent diabetes complications, lose weight, lead a healthier lifestyle, become more fit     Outcomes   Expected Outcomes Demonstrated interest in learning. Expect positive outcomes    Future DMSE 4-6 wks        Individualized Plan for Diabetes Self-Management Training:   Learning Objective:  Patient will have a greater understanding of diabetes self-management. Patient education plan is to attend individual and/or group sessions per assessed needs and concerns.   Plan:   Patient Instructions  Check blood sugars 1 x day before breakfast or 2 hrs after supper every day Bring blood sugar records to the next appointment  Exercise:  Begin walking  for    15  minutes   3  days a week and gradually increase to 30 minutes 5 x week  Eat 3 meals day,   1-2  snacks a day Space meals 4-6 hours apart Don't skip meals  - include at least 1 protein and 1 carbohydrate serving Avoid sugar sweetened drinks (soda, juices) Limit desserts/sweets Complete 3 Day Food Record and bring to next appt  Return for appointment on:   Friday October 15, 2021 at 1:30 pm with Pam (dietitian)  Expected Outcomes:  Demonstrated interest in learning. Expect positive outcomes  Education material provided: General Meal Planning Guidelines Simple Meal Plan 3 Day Food  Record Carb-Mindful Smoothie Handout  If problems or questions, patient to contact team via:   Sharion Settler, RN, CCM, CDCES [phone]  Future DSME appointment: 4-6 wks October 15, 2021 with the dietitian

## 2021-09-07 NOTE — Patient Instructions (Addendum)
Check blood sugars 1 x day before breakfast or 2 hrs after supper every day Bring blood sugar records to the next appointment  Exercise:  Begin walking  for    15  minutes   3  days a week and gradually increase to 30 minutes 5 x week  Eat 3 meals day,   1-2  snacks a day Space meals 4-6 hours apart Don't skip meals  - include at least 1 protein and 1 carbohydrate serving Avoid sugar sweetened drinks (soda, juices) Limit desserts/sweets Complete 3 Day Food Record and bring to next appt  Return for appointment on:   Friday October 15, 2021 at 1:30 pm with [facility] (dietitian)

## 2021-10-15 ENCOUNTER — Ambulatory Visit: Payer: 59 | Admitting: Dietician

## 2021-10-19 ENCOUNTER — Other Ambulatory Visit: Payer: Self-pay

## 2021-10-19 ENCOUNTER — Ambulatory Visit
Admission: RE | Admit: 2021-10-19 | Discharge: 2021-10-19 | Disposition: A | Discharge status: Home / Self Care | Payer: 59 | Source: Ambulatory Visit | Attending: Nurse Practitioner | Admitting: Nurse Practitioner

## 2021-10-19 DIAGNOSIS — Z1231 Encounter for screening mammogram for malignant neoplasm of breast: Secondary | ICD-10-CM | POA: Insufficient documentation

## 2021-10-21 ENCOUNTER — Other Ambulatory Visit: Payer: Self-pay | Admitting: *Deleted

## 2021-10-21 ENCOUNTER — Inpatient Hospital Stay
Admission: RE | Admit: 2021-10-21 | Discharge: 2021-10-21 | Disposition: A | Discharge status: Home / Self Care | Payer: Self-pay | Source: Ambulatory Visit | Attending: *Deleted | Admitting: *Deleted

## 2021-10-21 DIAGNOSIS — Z1231 Encounter for screening mammogram for malignant neoplasm of breast: Secondary | ICD-10-CM

## 2021-11-02 ENCOUNTER — Other Ambulatory Visit: Payer: Self-pay | Admitting: Nurse Practitioner

## 2021-11-02 DIAGNOSIS — R928 Other abnormal and inconclusive findings on diagnostic imaging of breast: Secondary | ICD-10-CM

## 2021-11-02 DIAGNOSIS — N6489 Other specified disorders of breast: Secondary | ICD-10-CM

## 2021-11-08 ENCOUNTER — Encounter: Payer: Self-pay | Admitting: Dietician

## 2021-11-08 NOTE — Progress Notes (Signed)
Have not heard back from patient to reschedule her cancelled appointment from 10/15/21 (cancelled due to insurance coverage issues). Sent notification to referring provider.

## 2022-02-14 ENCOUNTER — Encounter: Payer: BLUE CROSS/BLUE SHIELD | Admitting: Oncology

## 2022-02-14 ENCOUNTER — Other Ambulatory Visit: Payer: BLUE CROSS/BLUE SHIELD

## 2022-12-12 ENCOUNTER — Ambulatory Visit: Payer: Self-pay | Admitting: Nurse Practitioner

## 2023-01-02 ENCOUNTER — Other Ambulatory Visit: Payer: BLUE CROSS/BLUE SHIELD

## 2023-01-02 DIAGNOSIS — I10 Essential (primary) hypertension: Secondary | ICD-10-CM

## 2023-01-02 DIAGNOSIS — R7301 Impaired fasting glucose: Secondary | ICD-10-CM

## 2023-01-02 DIAGNOSIS — E785 Hyperlipidemia, unspecified: Secondary | ICD-10-CM

## 2023-01-03 ENCOUNTER — Ambulatory Visit: Payer: BLUE CROSS/BLUE SHIELD | Admitting: Nurse Practitioner

## 2023-01-03 LAB — CMP14+EGFR
ALT: 29 IU/L (ref 0–32)
AST: 34 IU/L (ref 0–40)
Albumin/Globulin Ratio: 1.8 (ref 1.2–2.2)
Albumin: 4.9 g/dL (ref 3.8–4.9)
Alkaline Phosphatase: 133 IU/L — ABNORMAL HIGH (ref 44–121)
BUN/Creatinine Ratio: 11 — ABNORMAL LOW (ref 12–28)
BUN: 9 mg/dL (ref 8–27)
Bilirubin Total: 1.6 mg/dL — ABNORMAL HIGH (ref 0.0–1.2)
CO2: 23 mmol/L (ref 20–29)
Calcium: 10.4 mg/dL — ABNORMAL HIGH (ref 8.7–10.3)
Chloride: 97 mmol/L (ref 96–106)
Creatinine, Ser: 0.84 mg/dL (ref 0.57–1.00)
Globulin, Total: 2.8 g/dL (ref 1.5–4.5)
Glucose: 200 mg/dL — ABNORMAL HIGH (ref 70–99)
Potassium: 4.7 mmol/L (ref 3.5–5.2)
Sodium: 142 mmol/L (ref 134–144)
Total Protein: 7.7 g/dL (ref 6.0–8.5)
eGFR: 80 mL/min/{1.73_m2} (ref >59)

## 2023-01-03 LAB — CBC WITH DIFFERENTIAL
Basophils Absolute: 0.1 10*3/uL (ref 0.0–0.2)
Basos: 1 %
EOS (ABSOLUTE): 0.1 10*3/uL (ref 0.0–0.4)
Eos: 1 %
Hematocrit: 45.7 % (ref 34.0–46.6)
Hemoglobin: 15.6 g/dL (ref 11.1–15.9)
Immature Grans (Abs): 0 10*3/uL (ref 0.0–0.1)
Immature Granulocytes: 0 %
Lymphocytes Absolute: 2.6 10*3/uL (ref 0.7–3.1)
Lymphs: 42 %
MCH: 29.5 pg (ref 26.6–33.0)
MCHC: 34.1 g/dL (ref 31.5–35.7)
MCV: 87 fL (ref 79–97)
Monocytes Absolute: 0.4 10*3/uL (ref 0.1–0.9)
Monocytes: 7 %
Neutrophils Absolute: 3 10*3/uL (ref 1.4–7.0)
Neutrophils: 49 %
RBC: 5.28 x10E6/uL (ref 3.77–5.28)
RDW: 12.7 % (ref 11.7–15.4)
WBC: 6.1 10*3/uL (ref 3.4–10.8)

## 2023-01-03 LAB — LIPID PANEL
Chol/HDL Ratio: 4.6 ratio — ABNORMAL HIGH (ref 0.0–4.4)
Cholesterol, Total: 233 mg/dL — ABNORMAL HIGH (ref 100–199)
HDL: 51 mg/dL (ref >39)
LDL Chol Calc (NIH): 145 mg/dL — ABNORMAL HIGH (ref 0–99)
Triglycerides: 208 mg/dL — ABNORMAL HIGH (ref 0–149)
VLDL Cholesterol Cal: 37 mg/dL (ref 5–40)

## 2023-01-03 LAB — TSH: TSH: 1.64 u[IU]/mL (ref 0.450–4.500)

## 2023-01-03 LAB — HEMOGLOBIN A1C
Est. average glucose Bld gHb Est-mCnc: 292 mg/dL
Hgb A1c MFr Bld: 11.8 % — ABNORMAL HIGH (ref 4.8–5.6)

## 2023-01-14 ENCOUNTER — Other Ambulatory Visit: Payer: Self-pay | Admitting: Nurse Practitioner

## 2023-01-26 ENCOUNTER — Ambulatory Visit: Payer: BLUE CROSS/BLUE SHIELD | Admitting: Nurse Practitioner

## 2023-01-30 ENCOUNTER — Encounter: Payer: Self-pay | Admitting: Nurse Practitioner

## 2023-01-30 ENCOUNTER — Ambulatory Visit (INDEPENDENT_AMBULATORY_CARE_PROVIDER_SITE_OTHER): Payer: BLUE CROSS/BLUE SHIELD | Admitting: Nurse Practitioner

## 2023-01-30 VITALS — BP 110/64 | HR 112 | Ht 67.0 in | Wt 165.0 lb

## 2023-01-30 DIAGNOSIS — I10 Essential (primary) hypertension: Secondary | ICD-10-CM | POA: Diagnosis not present

## 2023-01-30 DIAGNOSIS — E782 Mixed hyperlipidemia: Secondary | ICD-10-CM

## 2023-01-30 DIAGNOSIS — F419 Anxiety disorder, unspecified: Secondary | ICD-10-CM

## 2023-01-30 DIAGNOSIS — E1165 Type 2 diabetes mellitus with hyperglycemia: Secondary | ICD-10-CM | POA: Insufficient documentation

## 2023-01-30 DIAGNOSIS — E118 Type 2 diabetes mellitus with unspecified complications: Secondary | ICD-10-CM | POA: Diagnosis not present

## 2023-01-30 MED ORDER — TRESIBA FLEXTOUCH 100 UNIT/ML ~~LOC~~ SOPN: 20.0000 [IU] | PEN_INJECTOR | Freq: Every day | SUBCUTANEOUS | 3 refills | Status: DC

## 2023-01-30 MED ORDER — INSULIN LISPRO (1 UNIT DIAL) 100 UNIT/ML (KWIKPEN): 7.0000 [IU] | PEN_INJECTOR | Freq: Three times a day (TID) | SUBCUTANEOUS | 11 refills | Status: DC

## 2023-01-30 NOTE — Patient Instructions (Signed)
1) Stop farxiga 2) Starting on Libre 3 today 3) Start Humalog at 7 units prior to each meal 4) Start tresiba at 20 units at night 5) Follow up appt in 2 weeks

## 2023-01-30 NOTE — Progress Notes (Signed)
Established Patient Office Visit  Subjective:  Patient ID: Gabriella Young, female    DOB: 1961/06/30  Age: 62 y.o. MRN: [id number]  No chief complaint on file.   Follow up and discuss abnormal labs, A1c     No other concerns at this time.   Past Medical History:  Diagnosis Date   Anemia    HX OF/ RESOLVED AFTER HYSTERECTOMY   Arthritis    HAND,FEET,KNEES   Bunion right   08.21.2014, surgery scheduled 9.26.14   Bursitis 06/20/2013   plantar aspect of hallux right   Congenital absence of one kidney    RIGHT KIDNEY NEVER DEVELOPED, REMOVED AT 62 YEARS OLD   Dental crowns present    VENEERS ON FRONT UPPER TEETH   Diabetes mellitus without complication (HCC)    High cholesterol    Hypertension    CONTROLLED ON MEDS   Legally blind    WEARS CONTACTS   Plantar fasciitis of right foot 05/23/2013   WITH LATERAL COMPENSATORY SYNDROME   Seasonal allergies    Sesamoiditis 06/20/2013   HALLUX RIGHT AND HALLUX INTERPHALANGEUM   Tachycardia    ON MEDS/ DR Laurelyn Sickle    Vertigo    HX OF THREE YEARS AGO   Wears contact lenses     Past Surgical History:  Procedure Laterality Date   ABDOMINAL HYSTERECTOMY     BREAST BIOPSY Left 2013   ESOPHAGEAL DILATION N/A 07/07/2016   Procedure: ESOPHAGOGASTRODUODENOSCOPY (EGD);  Surgeon: Lucilla Lame, MD;  Location: [place];  Service: Endoscopy;  Laterality: N/A;   KIDNEY SURGERY Right    REMOVED   PARTIAL HYSTERECTOMY     RHINOPLASTY     TOE SURGERY Right    screw in great toe   TONSILECTOMY, ADENOIDECTOMY, BILATERAL MYRINGOTOMY AND TUBES     TUBAL LIGATION      Social History   Socioeconomic History   Marital status: Married    Spouse name: Shanon Brow   Number of children: 2   Years of education: 16+   Highest education level: Not on file  Occupational History   Occupation: Unemployed  Tobacco Use   Smoking status: Never   Smokeless tobacco: Never  Vaping Use   Vaping Use: Never used  Substance and Sexual Activity    Alcohol use: No   Drug use: No   Sexual activity: Not Currently  Other Topics Concern   Not on file  Social History Narrative   Lives with husband, Shanon Brow   Caffeine use: 1 cup coffee 3x/week   Pepsi with meals sometimes    Social Determinants of Radio broadcast assistant Strain: Not on file  Food Insecurity: Not on file  Transportation Needs: Not on file  Physical Activity: Not on file  Stress: Not on file  Social Connections: Not on file  Intimate Partner Violence: Not on file    Family History  Problem Relation Age of Onset   Diabetes Mother    Dementia Mother    Diabetes Father    Heart attack Father    Diabetes Maternal Aunt    Breast cancer Paternal Aunt 48   Diabetes Maternal Grandmother    Dementia Maternal Grandmother    Stroke Maternal Grandmother     Allergies  Allergen Reactions   Codeine Other (See Comments)    MAKES DROWSY/ NOT REALLY ALLERGY, DOESN'T LIKE THE FEELING   Sulfa Antibiotics Hives   Sulfasalazine Hives    Review of Systems  Constitutional: Negative.   HENT:  Negative.    Eyes: Negative.   Respiratory: Negative.    Cardiovascular: Negative.   Gastrointestinal: Negative.   Genitourinary: Negative.   Musculoskeletal: Negative.   Skin: Negative.   Neurological: Negative.   Endo/Heme/Allergies: Negative.   Psychiatric/Behavioral:  The patient is nervous/anxious.        Objective:   There were no vitals taken for this visit.  There were no vitals filed for this visit.  Physical Exam Vitals reviewed.  Constitutional:      Appearance: Normal appearance.  HENT:     Head: Normocephalic.     Nose: Nose normal.     Mouth/Throat:     Mouth: Mucous membranes are moist.  Eyes:     Pupils: Pupils are equal, round, and reactive to light.  Cardiovascular:     Rate and Rhythm: Normal rate and regular rhythm.  Pulmonary:     Effort: Pulmonary effort is normal.     Breath sounds: Normal breath sounds.  Abdominal:     General:  Bowel sounds are normal.     Palpations: Abdomen is soft.  Musculoskeletal:        General: Normal range of motion.     Cervical back: Normal range of motion and neck supple.  Skin:    General: Skin is warm and dry.  Neurological:     Mental Status: She is alert and oriented to person, place, and time.  Psychiatric:        Mood and Affect: Mood normal.        Behavior: Behavior normal.      No results found for any visits on 01/30/23.  Recent Results (from the past 2160 hour(s))  CBC With Differential     Status: None   Collection Time: 01/02/23 11:31 AM  Result Value Ref Range   WBC 6.1 3.4 - 10.8 x10E3/uL   RBC 5.28 3.77 - 5.28 x10E6/uL   Hemoglobin 15.6 11.1 - 15.9 g/dL   Hematocrit 45.7 34.0 - 46.6 %   MCV 87 79 - 97 fL   MCH 29.5 26.6 - 33.0 pg   MCHC 34.1 31.5 - 35.7 g/dL   RDW 12.7 11.7 - 15.4 %   Neutrophils 49 Not Estab. %   Lymphs 42 Not Estab. %   Monocytes 7 Not Estab. %   Eos 1 Not Estab. %   Basos 1 Not Estab. %   Neutrophils Absolute 3.0 1.4 - 7.0 x10E3/uL   Lymphocytes Absolute 2.6 0.7 - 3.1 x10E3/uL   Monocytes Absolute 0.4 0.1 - 0.9 x10E3/uL   EOS (ABSOLUTE) 0.1 0.0 - 0.4 x10E3/uL   Basophils Absolute 0.1 0.0 - 0.2 x10E3/uL   Immature Granulocytes 0 Not Estab. %   Immature Grans (Abs) 0.0 0.0 - 0.1 x10E3/uL  Hemoglobin A1c     Status: Abnormal   Collection Time: 01/02/23 11:31 AM  Result Value Ref Range   Hgb A1c MFr Bld 11.8 (H) 4.8 - 5.6 %    Comment:          Prediabetes: 5.7 - 6.4          Diabetes: >6.4          Glycemic control for adults with diabetes: <7.0    Est. average glucose Bld gHb Est-mCnc 292 mg/dL  TSH     Status: None   Collection Time: 01/02/23 11:31 AM  Result Value Ref Range   TSH 1.640 0.450 - 4.500 uIU/mL  CMP14+EGFR     Status: Abnormal   Collection Time:  01/02/23 11:31 AM  Result Value Ref Range   Glucose 200 (H) 70 - 99 mg/dL   BUN 9 8 - 27 mg/dL   Creatinine, Ser 0.84 0.57 - 1.00 mg/dL   eGFR 80 >59 mL/min/1.73    BUN/Creatinine Ratio 11 (L) 12 - 28   Sodium 142 134 - 144 mmol/L   Potassium 4.7 3.5 - 5.2 mmol/L   Chloride 97 96 - 106 mmol/L   CO2 23 20 - 29 mmol/L   Calcium 10.4 (H) 8.7 - 10.3 mg/dL   Total Protein 7.7 6.0 - 8.5 g/dL   Albumin 4.9 3.8 - 4.9 g/dL   Globulin, Total 2.8 1.5 - 4.5 g/dL   Albumin/Globulin Ratio 1.8 1.2 - 2.2   Bilirubin Total 1.6 (H) 0.0 - 1.2 mg/dL   Alkaline Phosphatase 133 (H) 44 - 121 IU/L   AST 34 0 - 40 IU/L   ALT 29 0 - 32 IU/L  Lipid panel     Status: Abnormal   Collection Time: 01/02/23 11:31 AM  Result Value Ref Range   Cholesterol, Total 233 (H) 100 - 199 mg/dL   Triglycerides 208 (H) 0 - 149 mg/dL   HDL 51 >39 mg/dL   VLDL Cholesterol Cal 37 5 - 40 mg/dL   LDL Chol Calc (NIH) 145 (H) 0 - 99 mg/dL   Chol/HDL Ratio 4.6 (H) 0.0 - 4.4 ratio    Comment:                                   T. Chol/HDL Ratio                                             Men  Women                               1/2 Avg.Risk  3.4    3.3                                   Avg.Risk  5.0    4.4                                2X Avg.Risk  9.6    7.1                                3X Avg.Risk 23.4   11.0       Assessment & Plan:   Problem List Items Addressed This Visit       Cardiovascular and Mediastinum   Benign essential hypertension - Primary     Endocrine   Poorly controlled type 2 diabetes mellitus with complication     Other   Hyperlipidemia   Anxiety    No follow-ups on file.   Total time spent: 35 minutes  Evern Bio, NP  01/30/2023

## 2023-01-31 ENCOUNTER — Other Ambulatory Visit: Payer: Self-pay | Admitting: Nurse Practitioner

## 2023-01-31 ENCOUNTER — Telehealth: Payer: Self-pay

## 2023-01-31 MED ORDER — FREESTYLE LIBRE 2 SENSOR MISC: 1.0000 [IU] | 11 refills | Status: DC

## 2023-01-31 NOTE — Telephone Encounter (Signed)
Can you let me know what she means?  She has the [place] which does not have a needle and she has pen needles that came with her sample of Tresiba?

## 2023-01-31 NOTE — Telephone Encounter (Signed)
Patient called stating that she bent the needle before it was put in her arm can you send in an rx for her to [facility]

## 2023-01-31 NOTE — Telephone Encounter (Signed)
Please ask patient to call her insurance to find out which basal insulin they will cover if they are not covering [place].  She has a sample of it so use that until it is finished.   I sent in the meal time insulin yesterday.

## 2023-02-01 ENCOUNTER — Other Ambulatory Visit: Payer: Self-pay

## 2023-02-01 MED ORDER — FREESTYLE LIBRE 3 SENSOR MISC: 2.0000 | 11 refills | Status: DC

## 2023-02-06 ENCOUNTER — Other Ambulatory Visit: Payer: Self-pay

## 2023-02-06 ENCOUNTER — Other Ambulatory Visit: Payer: Self-pay | Admitting: Nurse Practitioner

## 2023-02-06 ENCOUNTER — Telehealth: Payer: Self-pay | Admitting: Nurse Practitioner

## 2023-02-06 MED ORDER — "BD DISP NEEDLES 25G X 5/8"" MISC": 1.0000 | Freq: Four times a day (QID) | 3 refills | Status: DC

## 2023-02-06 MED ORDER — INSULIN PEN NEEDLE 32G X 4 MM MISC: 1.0000 | Freq: Four times a day (QID) | 3 refills | Status: AC

## 2023-02-06 NOTE — Telephone Encounter (Signed)
Need the small pen needles for the Lispro injections. Please send to [facility] at [facility].

## 2023-02-08 ENCOUNTER — Telehealth: Payer: Self-pay

## 2023-02-08 NOTE — Telephone Encounter (Signed)
Patient called back stating that these are the two other alternatives (Semgle) and Levemir.please send pen of which one you suggest not the vial

## 2023-02-09 ENCOUNTER — Other Ambulatory Visit: Payer: Self-pay | Admitting: Nurse Practitioner

## 2023-02-09 DIAGNOSIS — E1165 Type 2 diabetes mellitus with hyperglycemia: Secondary | ICD-10-CM

## 2023-02-09 MED ORDER — INSULIN DETEMIR 100 UNIT/ML ~~LOC~~ SOLN
20.0000 [IU] | Freq: Every day | SUBCUTANEOUS | 11 refills | Status: DC
Start: 2023-02-09 — End: 2023-05-22

## 2023-02-13 ENCOUNTER — Ambulatory Visit: Payer: BLUE CROSS/BLUE SHIELD | Admitting: Nurse Practitioner

## 2023-02-17 ENCOUNTER — Ambulatory Visit: Payer: BLUE CROSS/BLUE SHIELD | Admitting: Nurse Practitioner

## 2023-02-20 ENCOUNTER — Ambulatory Visit (INDEPENDENT_AMBULATORY_CARE_PROVIDER_SITE_OTHER): Payer: Self-pay | Admitting: Nurse Practitioner

## 2023-02-20 ENCOUNTER — Encounter: Payer: Self-pay | Admitting: Nurse Practitioner

## 2023-02-20 VITALS — BP 112/84 | HR 95 | Ht 67.0 in | Wt 171.6 lb

## 2023-02-20 DIAGNOSIS — I10 Essential (primary) hypertension: Secondary | ICD-10-CM

## 2023-02-20 DIAGNOSIS — L0390 Cellulitis, unspecified: Secondary | ICD-10-CM

## 2023-02-20 DIAGNOSIS — F419 Anxiety disorder, unspecified: Secondary | ICD-10-CM

## 2023-02-20 DIAGNOSIS — E782 Mixed hyperlipidemia: Secondary | ICD-10-CM

## 2023-02-20 DIAGNOSIS — E118 Type 2 diabetes mellitus with unspecified complications: Secondary | ICD-10-CM

## 2023-02-20 DIAGNOSIS — E1165 Type 2 diabetes mellitus with hyperglycemia: Secondary | ICD-10-CM

## 2023-02-20 MED ORDER — FREESTYLE LIBRE 3 SENSOR MISC: 2.0000 | 11 refills | Status: DC

## 2023-02-20 MED ORDER — DOXYCYCLINE HYCLATE 100 MG PO TABS
100.0000 mg | ORAL_TABLET | Freq: Two times a day (BID) | ORAL | 0 refills | Status: DC
Start: 2023-02-20 — End: 2023-09-11

## 2023-02-20 NOTE — Progress Notes (Signed)
Established Patient Office Visit  Subjective:  Patient ID: Gabriella Young, female    DOB: Mar 25, 1961  Age: 62 y.o. MRN: [id number]  No chief complaint on file.   2 week follow up after starting mealtime and basal insulins.  Drinking more water, some tea with one packet of Sweet and Low.  CBG right now is 111 mg/dl.  Improving her eating habits.  No burgers, implementing more veggies.  CBG averages have been from 161 to 211.      No other concerns at this time.   Past Medical History:  Diagnosis Date   Anemia    HX OF/ RESOLVED AFTER HYSTERECTOMY   Arthritis    HAND,FEET,KNEES   Bunion right   08.21.2014, surgery scheduled 9.26.14   Bursitis 06/20/2013   plantar aspect of hallux right   Congenital absence of one kidney    RIGHT KIDNEY NEVER DEVELOPED, REMOVED AT 62 YEARS OLD   Dental crowns present    VENEERS ON FRONT UPPER TEETH   Diabetes mellitus without complication    High cholesterol    Hypertension    CONTROLLED ON MEDS   Legally blind    WEARS CONTACTS   Plantar fasciitis of right foot 05/23/2013   WITH LATERAL COMPENSATORY SYNDROME   Seasonal allergies    Sesamoiditis 06/20/2013   HALLUX RIGHT AND HALLUX INTERPHALANGEUM   Tachycardia    ON MEDS/ DR Milta Deiters    Vertigo    HX OF THREE YEARS AGO   Wears contact lenses     Past Surgical History:  Procedure Laterality Date   ABDOMINAL HYSTERECTOMY     BREAST BIOPSY Left 2013   ESOPHAGEAL DILATION N/A 07/07/2016   Procedure: ESOPHAGOGASTRODUODENOSCOPY (EGD);  Surgeon: Midge Minium, MD;  Location: [facility];  Service: Endoscopy;  Laterality: N/A;   KIDNEY SURGERY Right    REMOVED   PARTIAL HYSTERECTOMY     RHINOPLASTY     TOE SURGERY Right    screw in great toe   TONSILECTOMY, ADENOIDECTOMY, BILATERAL MYRINGOTOMY AND TUBES     TUBAL LIGATION      Social History   Socioeconomic History   Marital status: Married    Spouse name: [name]   Number of children: 2   Years of education: 16+    Highest education level: Not on file  Occupational History   Occupation: Unemployed  Tobacco Use   Smoking status: Never   Smokeless tobacco: Never  Vaping Use   Vaping Use: Never used  Substance and Sexual Activity   Alcohol use: No   Drug use: No   Sexual activity: Not Currently  Other Topics Concern   Not on file  Social History Narrative   Lives with husband, [name]   Caffeine use: 1 cup coffee 3x/week   Pepsi with meals sometimes    Social Determinants of Corporate investment banker Strain: Not on file  Food Insecurity: Not on file  Transportation Needs: Not on file  Physical Activity: Not on file  Stress: Not on file  Social Connections: Not on file  Intimate Partner Violence: Not on file    Family History  Problem Relation Age of Onset   Diabetes Mother    Dementia Mother    Diabetes Father    Heart attack Father    Diabetes Maternal Aunt    Breast cancer Paternal Aunt 48   Diabetes Maternal Grandmother    Dementia Maternal Grandmother    Stroke Maternal Grandmother  Allergies  Allergen Reactions   Codeine Other (See Comments)    MAKES DROWSY/ NOT REALLY ALLERGY, DOESN'T LIKE THE FEELING   Sulfa Antibiotics Hives   Sulfasalazine Hives    Review of Systems  Constitutional: Negative.   Eyes: Negative.   Respiratory: Negative.    Cardiovascular: Negative.   Gastrointestinal: Negative.   Genitourinary: Negative.   Musculoskeletal:  Positive for joint pain.  Skin: Negative.   Neurological: Negative.   Endo/Heme/Allergies: Negative.   Psychiatric/Behavioral:  The patient is nervous/anxious.        Objective:   There were no vitals taken for this visit.  There were no vitals filed for this visit.  Physical Exam Vitals reviewed.  Constitutional:      Appearance: Normal appearance.  HENT:     Head: Normocephalic.     Nose: Nose normal.     Mouth/Throat:     Mouth: Mucous membranes are moist.  Eyes:     Pupils: Pupils are equal, round,  and reactive to light.  Cardiovascular:     Rate and Rhythm: Normal rate and regular rhythm.  Pulmonary:     Effort: Pulmonary effort is normal.     Breath sounds: Normal breath sounds.  Abdominal:     General: Bowel sounds are normal.     Palpations: Abdomen is soft.  Musculoskeletal:        General: Tenderness present.     Cervical back: Normal range of motion and neck supple.  Skin:    General: Skin is warm and dry.  Neurological:     Mental Status: She is alert and oriented to person, place, and time.  Psychiatric:        Mood and Affect: Mood normal.        Behavior: Behavior normal.      No results found for any visits on 02/20/23.  Recent Results (from the past 2160 hour(s))  CBC With Differential     Status: None   Collection Time: 01/02/23 11:31 AM  Result Value Ref Range   WBC 6.1 3.4 - 10.8 x10E3/uL   RBC 5.28 3.77 - 5.28 x10E6/uL   Hemoglobin 15.6 11.1 - 15.9 g/dL   Hematocrit 16.1 09.6 - 46.6 %   MCV 87 79 - 97 fL   MCH 29.5 26.6 - 33.0 pg   MCHC 34.1 31.5 - 35.7 g/dL   RDW 04.5 40.9 - 81.1 %   Neutrophils 49 Not Estab. %   Lymphs 42 Not Estab. %   Monocytes 7 Not Estab. %   Eos 1 Not Estab. %   Basos 1 Not Estab. %   Neutrophils Absolute 3.0 1.4 - 7.0 x10E3/uL   Lymphocytes Absolute 2.6 0.7 - 3.1 x10E3/uL   Monocytes Absolute 0.4 0.1 - 0.9 x10E3/uL   EOS (ABSOLUTE) 0.1 0.0 - 0.4 x10E3/uL   Basophils Absolute 0.1 0.0 - 0.2 x10E3/uL   Immature Granulocytes 0 Not Estab. %   Immature Grans (Abs) 0.0 0.0 - 0.1 x10E3/uL  Hemoglobin A1c     Status: Abnormal   Collection Time: 01/02/23 11:31 AM  Result Value Ref Range   Hgb A1c MFr Bld 11.8 (H) 4.8 - 5.6 %    Comment:          Prediabetes: 5.7 - 6.4          Diabetes: >6.4          Glycemic control for adults with diabetes: <7.0    Est. average glucose Bld gHb Est-mCnc 292 mg/dL  TSH     Status: None   Collection Time: 01/02/23 11:31 AM  Result Value Ref Range   TSH 1.640 0.450 - 4.500 uIU/mL   CMP14+EGFR     Status: Abnormal   Collection Time: 01/02/23 11:31 AM  Result Value Ref Range   Glucose 200 (H) 70 - 99 mg/dL   BUN 9 8 - 27 mg/dL   Creatinine, Ser 1.61 0.57 - 1.00 mg/dL   eGFR 80 >09 UE/AVW/0.98   BUN/Creatinine Ratio 11 (L) 12 - 28   Sodium 142 134 - 144 mmol/L   Potassium 4.7 3.5 - 5.2 mmol/L   Chloride 97 96 - 106 mmol/L   CO2 23 20 - 29 mmol/L   Calcium 10.4 (H) 8.7 - 10.3 mg/dL   Total Protein 7.7 6.0 - 8.5 g/dL   Albumin 4.9 3.8 - 4.9 g/dL   Globulin, Total 2.8 1.5 - 4.5 g/dL   Albumin/Globulin Ratio 1.8 1.2 - 2.2   Bilirubin Total 1.6 (H) 0.0 - 1.2 mg/dL   Alkaline Phosphatase 133 (H) 44 - 121 IU/L   AST 34 0 - 40 IU/L   ALT 29 0 - 32 IU/L  Lipid panel     Status: Abnormal   Collection Time: 01/02/23 11:31 AM  Result Value Ref Range   Cholesterol, Total 233 (H) 100 - 199 mg/dL   Triglycerides 119 (H) 0 - 149 mg/dL   HDL 51 >14 mg/dL   VLDL Cholesterol Cal 37 5 - 40 mg/dL   LDL Chol Calc (NIH) 782 (H) 0 - 99 mg/dL   Chol/HDL Ratio 4.6 (H) 0.0 - 4.4 ratio    Comment:                                   T. Chol/HDL Ratio                                             Men  Women                               1/2 Avg.Risk  3.4    3.3                                   Avg.Risk  5.0    4.4                                2X Avg.Risk  9.6    7.1                                3X Avg.Risk 23.4   11.0       Assessment & Plan:   Problem List Items Addressed This Visit       Cardiovascular and Mediastinum   Benign essential hypertension     Endocrine   Poorly controlled type 2 diabetes mellitus with complication     Other   Hyperlipidemia   Anxiety - Primary    No follow-ups on file.   Total time spent: 35 minutes  Orson Eva, NP  02/20/2023

## 2023-02-20 NOTE — Patient Instructions (Addendum)
1) Cont meal time insulins and bedtime insulins 2) Improved eating habits 3) Follow up appt in 3 months, fasting labs prior

## 2023-02-26 ENCOUNTER — Other Ambulatory Visit: Payer: Self-pay | Admitting: Nurse Practitioner

## 2023-04-02 IMAGING — MG MM DIGITAL SCREENING BILAT W/ TOMO AND CAD
8 series · 8 of 24 positions shown · non-contrast
Comparison: Previous exam(s).

CLINICAL DATA: Screening.

EXAM:
DIGITAL SCREENING BILATERAL MAMMOGRAM WITH TOMOSYNTHESIS AND CAD
TECHNIQUE: Bilateral screening digital craniocaudal and mediolateral oblique
mammograms were obtained. Bilateral screening digital breast
tomosynthesis was performed. The images were evaluated with
computer-aided detection.

[L MLO synth-2D]
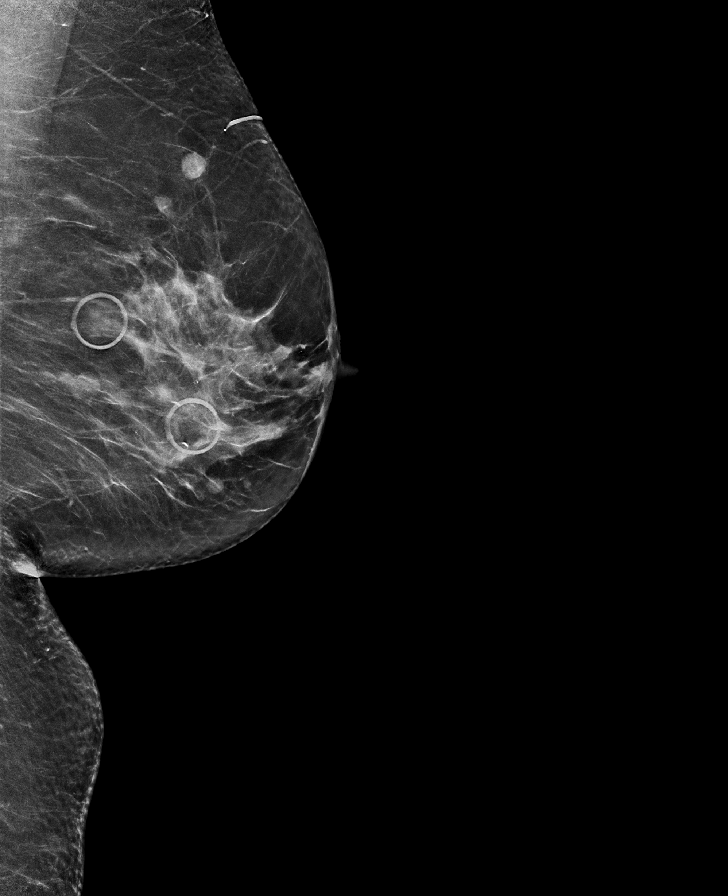

[L CC synth-2D]
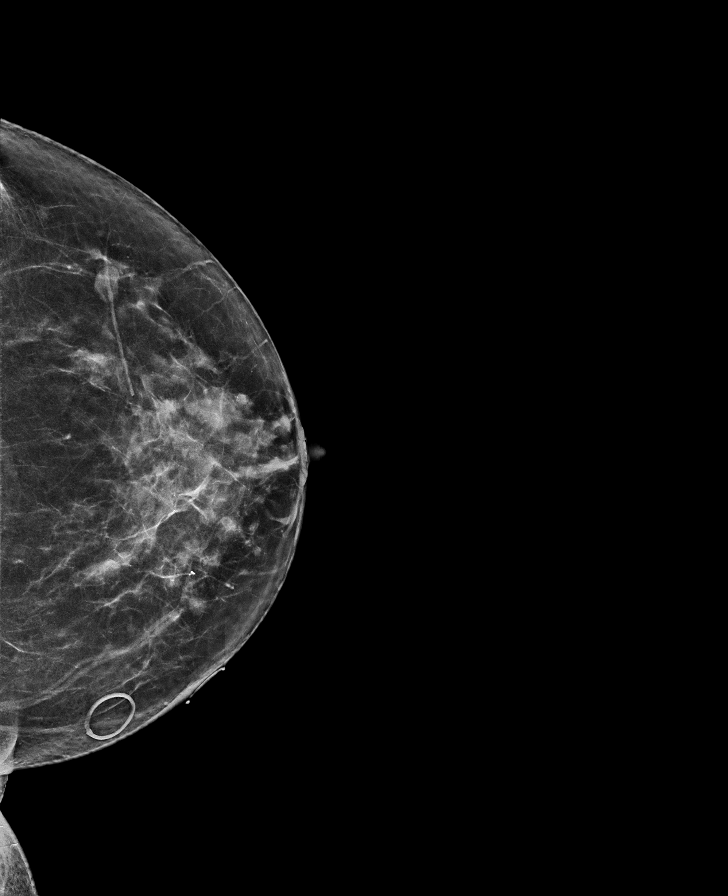

[R CC synth-2D]
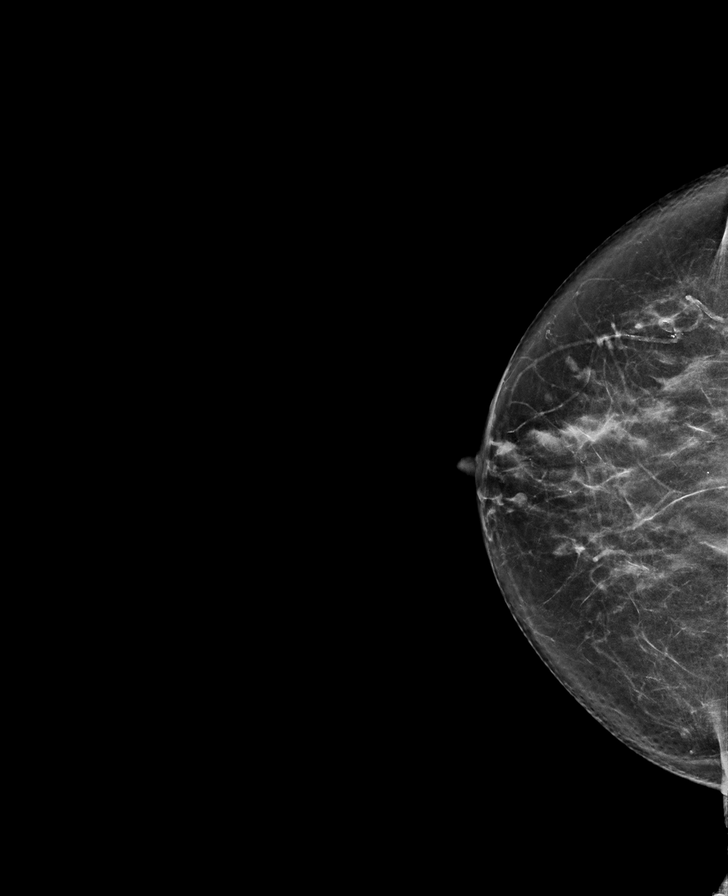

[R MLO synth-2D]
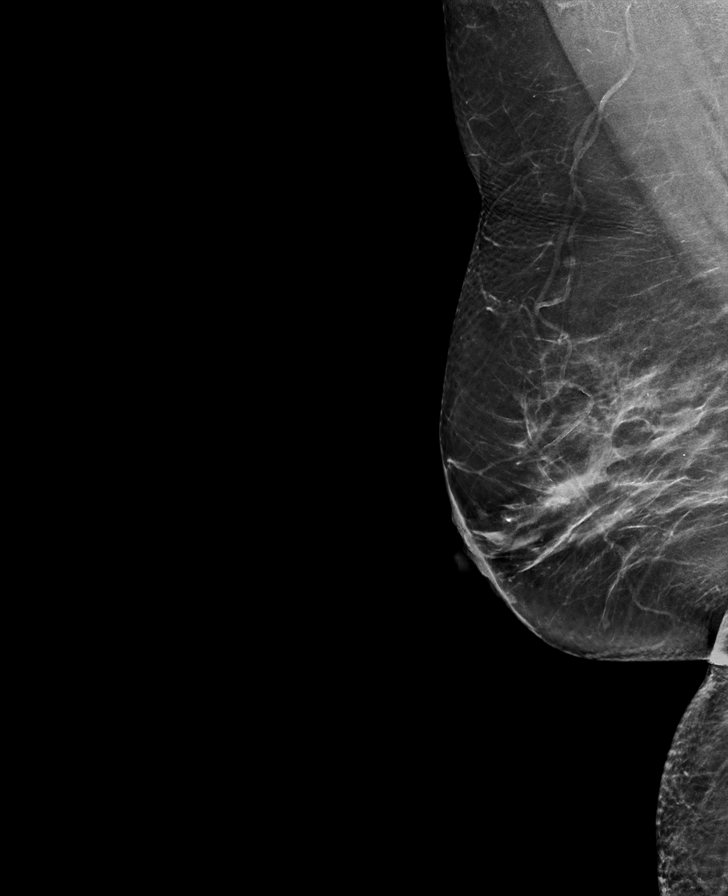

[R MLO tomo · tomo slice 35/68.0]
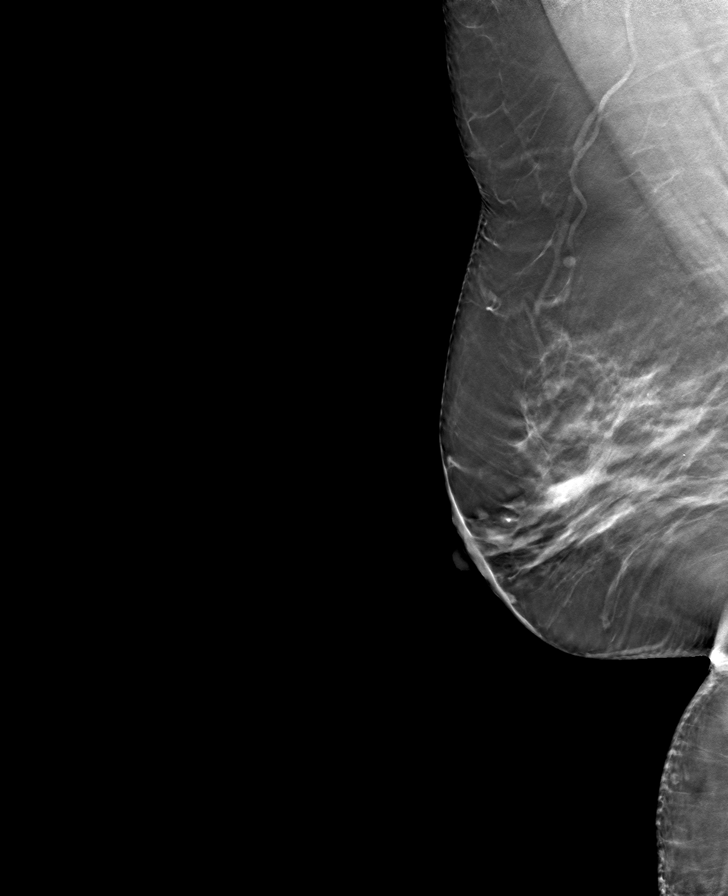

[L MLO tomo · tomo slice 35/68.0]
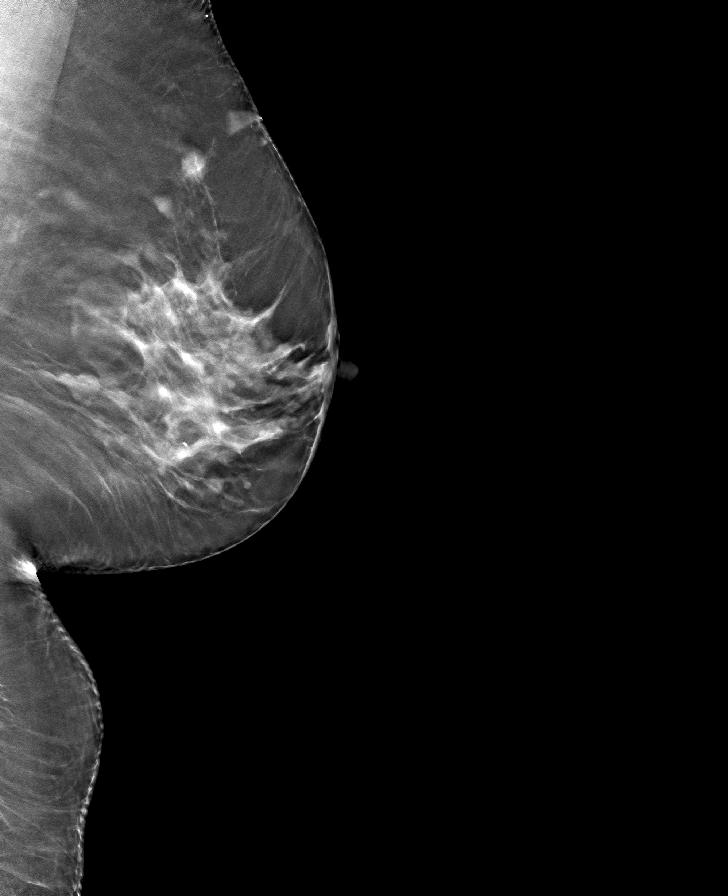

[L CC tomo · tomo slice 31/61.0]
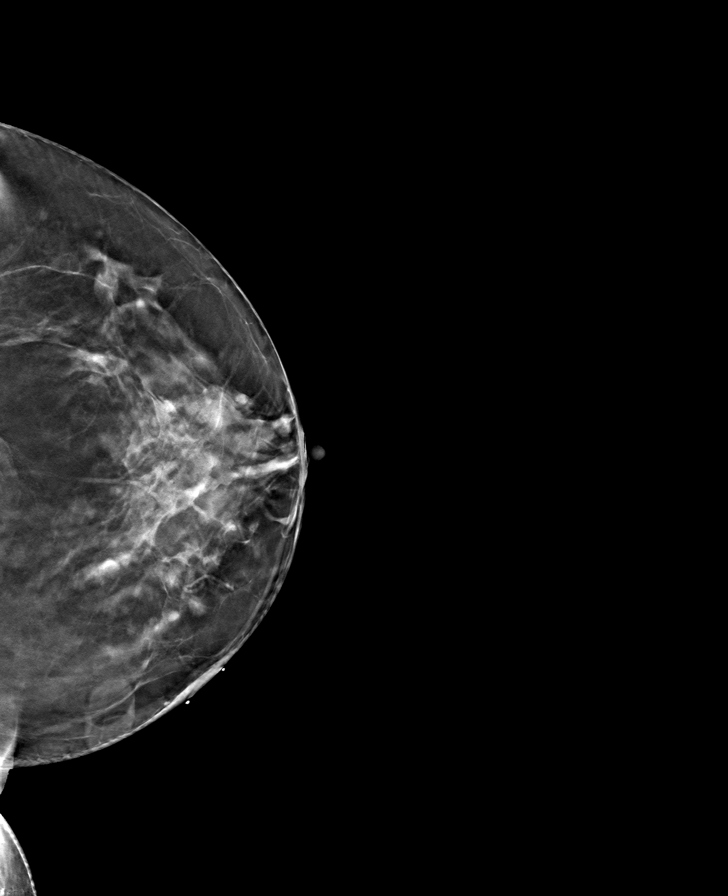

[R CC tomo · tomo slice 33/66.0]
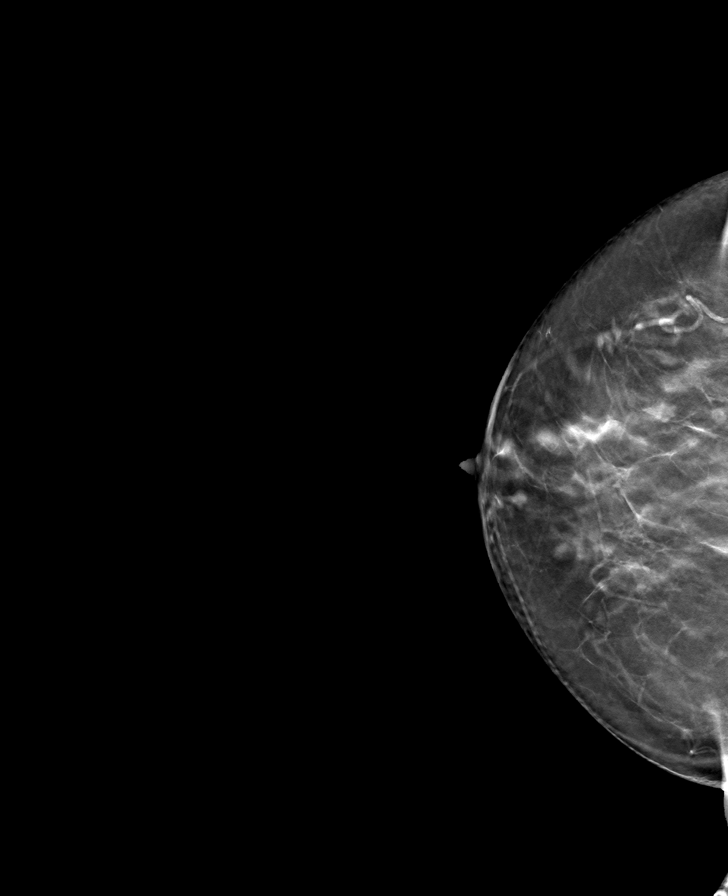

[8 of 24 positions shown; findings below may reference images not displayed]

ACR Breast Density Category c: The breast tissue is heterogeneously
dense, which may obscure small masses.
FINDINGS: In the right breast, a possible asymmetry warrants further
evaluation. In the left breast, no findings suspicious for
malignancy.
IMPRESSION: Further evaluation is suggested for a possible asymmetry in the
right breast.

RECOMMENDATION:
Diagnostic mammogram and possibly ultrasound of the right breast.
(Code:RC-T-AA6)

The patient will be contacted regarding the findings, and additional
imaging will be scheduled.

BI-RADS CATEGORY  0: Incomplete. Need additional imaging evaluation
and/or prior mammograms for comparison.

## 2023-04-11 ENCOUNTER — Ambulatory Visit: Payer: BLUE CROSS/BLUE SHIELD | Admitting: Nurse Practitioner

## 2023-04-18 ENCOUNTER — Ambulatory Visit: Payer: BLUE CROSS/BLUE SHIELD | Admitting: Nurse Practitioner

## 2023-05-19 ENCOUNTER — Other Ambulatory Visit: Payer: BLUE CROSS/BLUE SHIELD

## 2023-05-19 ENCOUNTER — Other Ambulatory Visit: Payer: Self-pay | Admitting: Internal Medicine

## 2023-05-19 DIAGNOSIS — E1165 Type 2 diabetes mellitus with hyperglycemia: Secondary | ICD-10-CM

## 2023-05-19 DIAGNOSIS — E782 Mixed hyperlipidemia: Secondary | ICD-10-CM

## 2023-05-19 DIAGNOSIS — I10 Essential (primary) hypertension: Secondary | ICD-10-CM

## 2023-05-20 LAB — CMP14+EGFR
ALT: 32 IU/L (ref 0–32)
AST: 33 IU/L (ref 0–40)
Albumin: 4.4 g/dL (ref 3.9–4.9)
Alkaline Phosphatase: 133 IU/L — ABNORMAL HIGH (ref 44–121)
BUN/Creatinine Ratio: 13 (ref 12–28)
BUN: 9 mg/dL (ref 8–27)
Bilirubin Total: 1 mg/dL (ref 0.0–1.2)
CO2: 25 mmol/L (ref 20–29)
Calcium: 10 mg/dL (ref 8.7–10.3)
Chloride: 102 mmol/L (ref 96–106)
Creatinine, Ser: 0.72 mg/dL (ref 0.57–1.00)
Globulin, Total: 2.8 g/dL (ref 1.5–4.5)
Glucose: 137 mg/dL — ABNORMAL HIGH (ref 70–99)
Potassium: 4.5 mmol/L (ref 3.5–5.2)
Sodium: 142 mmol/L (ref 134–144)
Total Protein: 7.2 g/dL (ref 6.0–8.5)
eGFR: 95 mL/min/{1.73_m2} (ref >59)

## 2023-05-20 LAB — LIPID PANEL W/O CHOL/HDL RATIO
Cholesterol, Total: 185 mg/dL (ref 100–199)
HDL: 50 mg/dL (ref >39)
LDL Chol Calc (NIH): 101 mg/dL — ABNORMAL HIGH (ref 0–99)
Triglycerides: 197 mg/dL — ABNORMAL HIGH (ref 0–149)
VLDL Cholesterol Cal: 34 mg/dL (ref 5–40)

## 2023-05-20 LAB — HEMOGLOBIN A1C
Est. average glucose Bld gHb Est-mCnc: 186 mg/dL
Hgb A1c MFr Bld: 8.1 % — ABNORMAL HIGH (ref 4.8–5.6)

## 2023-05-22 ENCOUNTER — Ambulatory Visit (INDEPENDENT_AMBULATORY_CARE_PROVIDER_SITE_OTHER): Payer: Self-pay | Admitting: Cardiology

## 2023-05-22 ENCOUNTER — Encounter: Payer: Self-pay | Admitting: Cardiology

## 2023-05-22 VITALS — BP 100/70 | HR 105 | Ht 68.0 in | Wt 178.4 lb

## 2023-05-22 DIAGNOSIS — G629 Polyneuropathy, unspecified: Secondary | ICD-10-CM

## 2023-05-22 DIAGNOSIS — E118 Type 2 diabetes mellitus with unspecified complications: Secondary | ICD-10-CM

## 2023-05-22 DIAGNOSIS — E1165 Type 2 diabetes mellitus with hyperglycemia: Secondary | ICD-10-CM

## 2023-05-22 DIAGNOSIS — I10 Essential (primary) hypertension: Secondary | ICD-10-CM

## 2023-05-22 DIAGNOSIS — E782 Mixed hyperlipidemia: Secondary | ICD-10-CM

## 2023-05-22 DIAGNOSIS — R000 Tachycardia, unspecified: Secondary | ICD-10-CM

## 2023-05-22 MED ORDER — GABAPENTIN 100 MG PO CAPS: 100.0000 mg | ORAL_CAPSULE | Freq: Every day | ORAL | 2 refills | Status: AC

## 2023-05-22 NOTE — Progress Notes (Signed)
Established Patient Office Visit  Subjective:  Patient ID: Gabriella Young, female    DOB: 1961-08-13  Age: 62 y.o. MRN: [id number]  No chief complaint on file.   Patient in office for 3 month follow up, discuss recent blood work. Patient complaining of peripheral neuropathy. Will start low dose gabapentin. Otherwise she is feeling well.  Hgb A1c improved. Patient to continue working on a strict diet and exercise.  Discussed diabetes and the effect it has on the body, importance of seeing an eye doctor yearly.  Discussed patient's sinus tachycardia, has not seen cardiology since 01/06/21. Patient is not currently taking a beta blocker, said she did not tolerate it. Recommend following up with cardiology for better heart rate control.    No other concerns at this time.   Past Medical History:  Diagnosis Date   Anemia    HX OF/ RESOLVED AFTER HYSTERECTOMY   Arthritis    HAND,FEET,KNEES   Bunion right   08.21.2014, surgery scheduled 9.26.14   Bursitis 06/20/2013   plantar aspect of hallux right   Congenital absence of one kidney    RIGHT KIDNEY NEVER DEVELOPED, REMOVED AT 62 YEARS OLD   Dental crowns present    VENEERS ON FRONT UPPER TEETH   Diabetes mellitus without complication (HCC)    High cholesterol    Hypertension    CONTROLLED ON MEDS   Legally blind    WEARS CONTACTS   Plantar fasciitis of right foot 05/23/2013   WITH LATERAL COMPENSATORY SYNDROME   Seasonal allergies    Sesamoiditis 06/20/2013   HALLUX RIGHT AND HALLUX INTERPHALANGEUM   Tachycardia    ON MEDS/ DR Milta Deiters    Vertigo    HX OF THREE YEARS AGO   Wears contact lenses     Past Surgical History:  Procedure Laterality Date   ABDOMINAL HYSTERECTOMY     BREAST BIOPSY Left 2013   ESOPHAGEAL DILATION N/A 07/07/2016   Procedure: ESOPHAGOGASTRODUODENOSCOPY (EGD);  Surgeon: Midge Minium, MD;  Location: [facility];  Service: Endoscopy;  Laterality: N/A;   KIDNEY SURGERY Right    REMOVED   PARTIAL  HYSTERECTOMY     RHINOPLASTY     TOE SURGERY Right    screw in great toe   TONSILECTOMY, ADENOIDECTOMY, BILATERAL MYRINGOTOMY AND TUBES     TUBAL LIGATION      Social History   Socioeconomic History   Marital status: Married    Spouse name: [name]   Number of children: 2   Years of education: 16+   Highest education level: Not on file  Occupational History   Occupation: Unemployed  Tobacco Use   Smoking status: Never   Smokeless tobacco: Never  Vaping Use   Vaping status: Never Used  Substance and Sexual Activity   Alcohol use: No   Drug use: No   Sexual activity: Not Currently  Other Topics Concern   Not on file  Social History Narrative   Lives with husband, [name]   Caffeine use: 1 cup coffee 3x/week   Pepsi with meals sometimes    Social Determinants of Corporate investment banker Strain: Not on file  Food Insecurity: Not on file  Transportation Needs: Not on file  Physical Activity: Not on file  Stress: Not on file  Social Connections: Not on file  Intimate Partner Violence: Not on file    Family History  Problem Relation Age of Onset   Diabetes Mother    Dementia Mother  Diabetes Father    Heart attack Father    Diabetes Maternal Aunt    Breast cancer Paternal Aunt 13   Diabetes Maternal Grandmother    Dementia Maternal Grandmother    Stroke Maternal Grandmother     Allergies  Allergen Reactions   Codeine Other (See Comments)    MAKES DROWSY/ NOT REALLY ALLERGY, DOESN'T LIKE THE FEELING   Sulfa Antibiotics Hives   Sulfasalazine Hives    Review of Systems  Constitutional: Negative.   HENT: Negative.    Eyes: Negative.   Respiratory: Negative.  Negative for shortness of breath.   Cardiovascular: Negative.  Negative for chest pain.  Gastrointestinal: Negative.  Negative for abdominal pain, constipation and diarrhea.  Genitourinary: Negative.   Musculoskeletal:  Negative for joint pain and myalgias.  Skin: Negative.   Neurological:  Negative.  Negative for dizziness and headaches.  Endo/Heme/Allergies: Negative.   All other systems reviewed and are negative.      Objective:   BP 100/70   Pulse (!) 105   Ht 5\' 8"  (1.727 m)   Wt 178 lb 6.4 oz (80.9 kg)   SpO2 97%   BMI 27.13 kg/m   Vitals:   05/22/23 1358  BP: 100/70  Pulse: (!) 105  Height: 5\' 8"  (1.727 m)  Weight: 178 lb 6.4 oz (80.9 kg)  SpO2: 97%  BMI (Calculated): 27.13    Physical Exam Vitals and nursing note reviewed.  Constitutional:      Appearance: Normal appearance. She is normal weight.  HENT:     Head: Normocephalic and atraumatic.     Nose: Nose normal.     Mouth/Throat:     Mouth: Mucous membranes are moist.  Eyes:     Extraocular Movements: Extraocular movements intact.     Conjunctiva/sclera: Conjunctivae normal.     Pupils: Pupils are equal, round, and reactive to light.  Cardiovascular:     Rate and Rhythm: Normal rate and regular rhythm.     Pulses: Normal pulses.     Heart sounds: Normal heart sounds.  Pulmonary:     Effort: Pulmonary effort is normal.     Breath sounds: Normal breath sounds.  Abdominal:     General: Abdomen is flat. Bowel sounds are normal.     Palpations: Abdomen is soft.  Musculoskeletal:        General: Normal range of motion.     Cervical back: Normal range of motion.  Skin:    General: Skin is warm and dry.  Neurological:     General: No focal deficit present.     Mental Status: She is alert and oriented to person, place, and time.  Psychiatric:        Mood and Affect: Mood normal.        Behavior: Behavior normal.        Thought Content: Thought content normal.        Judgment: Judgment normal.      No results found for any visits on 05/22/23.  Recent Results (from the past 2160 hour(s))  Lipid Panel w/o Chol/HDL Ratio     Status: Abnormal   Collection Time: 05/19/23 10:57 AM  Result Value Ref Range   Cholesterol, Total 185 100 - 199 mg/dL   Triglycerides 161 (H) 0 - 149 mg/dL    HDL 50 >09 mg/dL   VLDL Cholesterol Cal 34 5 - 40 mg/dL   LDL Chol Calc (NIH) 604 (H) 0 - 99 mg/dL  [id number]  Status: Abnormal   Collection Time: 05/19/23 10:57 AM  Result Value Ref Range   Glucose 137 (H) 70 - 99 mg/dL   BUN 9 8 - 27 mg/dL   Creatinine, Ser 8.46 0.57 - 1.00 mg/dL   eGFR 95 >96 EX/BMW/4.13   BUN/Creatinine Ratio 13 12 - 28   Sodium 142 134 - 144 mmol/L   Potassium 4.5 3.5 - 5.2 mmol/L   Chloride 102 96 - 106 mmol/L   CO2 25 20 - 29 mmol/L   Calcium 10.0 8.7 - 10.3 mg/dL   Total Protein 7.2 6.0 - 8.5 g/dL   Albumin 4.4 3.9 - 4.9 g/dL   Globulin, Total 2.8 1.5 - 4.5 g/dL   Bilirubin Total 1.0 0.0 - 1.2 mg/dL   Alkaline Phosphatase 133 (H) 44 - 121 IU/L   AST 33 0 - 40 IU/L   ALT 32 0 - 32 IU/L  Hemoglobin A1c     Status: Abnormal   Collection Time: 05/19/23 10:57 AM  Result Value Ref Range   Hgb A1c MFr Bld 8.1 (H) 4.8 - 5.6 %    Comment:          Prediabetes: 5.7 - 6.4          Diabetes: >6.4          Glycemic control for adults with diabetes: <7.0    Est. average glucose Bld gHb Est-mCnc 186 mg/dL      Assessment & Plan:  Gabapentin for neuropathy. Strict diet and exercise for DM. Schedule an eye appointment for diabetic retina exam. Follow up with cardiology.   Problem List Items Addressed This Visit       Cardiovascular and Mediastinum   Benign essential hypertension     Endocrine   Poorly controlled type 2 diabetes mellitus with complication (HCC) - Primary     Nervous and Auditory   Peripheral polyneuropathy   Relevant Medications   gabapentin (NEURONTIN) 100 MG capsule     Other   Hyperlipidemia   Sinus tachycardia    Return in about 4 weeks (around 06/19/2023).   Total time spent: 25 minutes  Google, NP  05/22/2023   This document may have been prepared by Dragon Voice Recognition software and as such may include unintentional dictation errors.

## 2023-05-30 ENCOUNTER — Other Ambulatory Visit: Payer: Self-pay | Admitting: Nurse Practitioner

## 2023-06-23 ENCOUNTER — Ambulatory Visit: Payer: BLUE CROSS/BLUE SHIELD | Admitting: Cardiovascular Disease

## 2023-07-11 ENCOUNTER — Other Ambulatory Visit: Payer: Self-pay

## 2023-07-12 ENCOUNTER — Other Ambulatory Visit: Payer: Self-pay

## 2023-07-13 MED ORDER — VENLAFAXINE HCL ER 37.5 MG PO CP24: 37.5000 mg | ORAL_CAPSULE | Freq: Every day | ORAL | 2 refills | Status: DC

## 2023-07-17 ENCOUNTER — Ambulatory Visit: Payer: BLUE CROSS/BLUE SHIELD | Admitting: Cardiovascular Disease

## 2023-07-24 ENCOUNTER — Encounter: Payer: Self-pay | Admitting: Cardiovascular Disease

## 2023-07-24 ENCOUNTER — Ambulatory Visit (INDEPENDENT_AMBULATORY_CARE_PROVIDER_SITE_OTHER): Payer: BLUE CROSS/BLUE SHIELD | Admitting: Cardiovascular Disease

## 2023-07-24 VITALS — BP 110/89 | HR 97 | Ht 68.0 in | Wt 181.2 lb

## 2023-07-24 DIAGNOSIS — E782 Mixed hyperlipidemia: Secondary | ICD-10-CM

## 2023-07-24 DIAGNOSIS — I10 Essential (primary) hypertension: Secondary | ICD-10-CM | POA: Diagnosis not present

## 2023-07-24 DIAGNOSIS — I259 Chronic ischemic heart disease, unspecified: Secondary | ICD-10-CM

## 2023-07-24 DIAGNOSIS — R000 Tachycardia, unspecified: Secondary | ICD-10-CM | POA: Diagnosis not present

## 2023-07-24 MED ORDER — LISINOPRIL 10 MG PO TABS
10.0000 mg | ORAL_TABLET | Freq: Every day | ORAL | 11 refills | Status: DC
Start: 2023-07-24 — End: 2024-08-12

## 2023-07-24 MED ORDER — DILTIAZEM HCL ER COATED BEADS 120 MG PO CP24
120.0000 mg | ORAL_CAPSULE | Freq: Every day | ORAL | 11 refills | Status: DC
Start: 2023-07-24 — End: 2023-10-10

## 2023-07-24 NOTE — Progress Notes (Signed)
Cardiology Office Note   Date:  07/24/2023   ID:  Gabriella Young, DOB 1961/04/14, MRN [id number]  PCP:  [name], MD  Cardiologist:  Adrian Blackwater, MD      History of Present Illness: Gabriella Young is a 61 y.o. female who presents for  Chief Complaint  Patient presents with   Follow-up    1 mo f/u    Has been having HR 90-110      Past Medical History:  Diagnosis Date   Anemia    HX OF/ RESOLVED AFTER HYSTERECTOMY   Arthritis    HAND,FEET,KNEES   Bunion right   08.21.2014, surgery scheduled 9.26.14   Bursitis 06/20/2013   plantar aspect of hallux right   Congenital absence of one kidney    RIGHT KIDNEY NEVER DEVELOPED, REMOVED AT 62 YEARS OLD   Dental crowns present    VENEERS ON FRONT UPPER TEETH   Diabetes mellitus without complication (HCC)    High cholesterol    Hypertension    CONTROLLED ON MEDS   Legally blind    WEARS CONTACTS   Plantar fasciitis of right foot 05/23/2013   WITH LATERAL COMPENSATORY SYNDROME   Seasonal allergies    Sesamoiditis 06/20/2013   HALLUX RIGHT AND HALLUX INTERPHALANGEUM   Tachycardia    ON MEDS/ DR Milta Deiters    Vertigo    HX OF THREE YEARS AGO   Wears contact lenses      Past Surgical History:  Procedure Laterality Date   ABDOMINAL HYSTERECTOMY     BREAST BIOPSY Left 2013   ESOPHAGEAL DILATION N/A 07/07/2016   Procedure: ESOPHAGOGASTRODUODENOSCOPY (EGD);  Surgeon: Midge Minium, MD;  Location: [facility];  Service: Endoscopy;  Laterality: N/A;   KIDNEY SURGERY Right    REMOVED   PARTIAL HYSTERECTOMY     RHINOPLASTY     TOE SURGERY Right    screw in great toe   TONSILECTOMY, ADENOIDECTOMY, BILATERAL MYRINGOTOMY AND TUBES     TUBAL LIGATION       Current Outpatient Medications  Medication Sig Dispense Refill   clonazePAM (KLONOPIN) 0.5 MG tablet Take by mouth.     Continuous Glucose Sensor (FREESTYLE LIBRE 3 SENSOR) MISC 2 each by Does not apply route every 14 (fourteen) days. Apply to arm every  14 days for sugar readings 2 each 11   diltiazem (CARDIZEM CD) 120 MG 24 hr capsule Take 1 capsule (120 mg total) by mouth daily. 30 capsule 11   doxycycline (VIBRA-TABS) 100 MG tablet Take 1 tablet (100 mg total) by mouth 2 (two) times daily. 20 tablet 0   fluticasone (FLONASE) 50 MCG/ACT nasal spray Place 2 sprays into both nostrils 2 (two) times daily.     gabapentin (NEURONTIN) 100 MG capsule Take 1 capsule (100 mg total) by mouth daily. 30 capsule 2   insulin lispro (HUMALOG KWIKPEN) 100 UNIT/ML KwikPen Inject 7 Units into the skin 3 (three) times daily. 15 mL 11   Insulin Pen Needle 32G X 4 MM MISC 1 each by Does not apply route in the morning, at noon, in the evening, and at bedtime. 120 each 3   lisinopril (ZESTRIL) 10 MG tablet Take 1 tablet (10 mg total) by mouth daily. 30 tablet 11   Multiple Vitamin (MULTIVITAMIN) tablet Take 1 tablet by mouth daily.     pravastatin (PRAVACHOL) 40 MG tablet TAKE 1 TABLET BY MOUTH EVERY EVENING 90 tablet 0   venlafaxine XR (EFFEXOR-XR) 37.5 MG 24  hr capsule Take 1 capsule (37.5 mg total) by mouth daily. 90 capsule 2   No current facility-administered medications for this visit.    Allergies:   Codeine, Sulfa antibiotics, and Sulfasalazine    Social History:   reports that she has never smoked. She has never used smokeless tobacco. She reports that she does not drink alcohol and does not use drugs.   Family History:  family history includes Breast cancer (age of onset: 56) in her paternal aunt; Dementia in her maternal grandmother and mother; Diabetes in her father, maternal aunt, maternal grandmother, and mother; Heart attack in her father; Stroke in her maternal grandmother.    ROS:     Review of Systems  Constitutional: Negative.   HENT: Negative.    Eyes: Negative.   Respiratory: Negative.    Gastrointestinal: Negative.   Genitourinary: Negative.   Musculoskeletal: Negative.   Skin: Negative.   Neurological: Negative.    Endo/Heme/Allergies: Negative.   Psychiatric/Behavioral: Negative.    All other systems reviewed and are negative.     All other systems are reviewed and negative.    PHYSICAL EXAM: VS:  BP 110/89   Pulse 97   Ht 5\' 8"  (1.727 m)   Wt 181 lb 3.2 oz (82.2 kg)   SpO2 98%   BMI 27.55 kg/m  , BMI Body mass index is 27.55 kg/m. Last weight:  Wt Readings from Last 3 Encounters:  07/24/23 181 lb 3.2 oz (82.2 kg)  05/22/23 178 lb 6.4 oz (80.9 kg)  02/20/23 171 lb 9.6 oz (77.8 kg)     Physical Exam Constitutional:      Appearance: Normal appearance.  Cardiovascular:     Rate and Rhythm: Normal rate and regular rhythm.     Heart sounds: Normal heart sounds.  Pulmonary:     Effort: Pulmonary effort is normal.     Breath sounds: Normal breath sounds.  Musculoskeletal:     Right lower leg: No edema.     Left lower leg: No edema.  Neurological:     Mental Status: She is alert.       EKG:   Recent Labs: 01/02/2023: Hemoglobin 15.6; TSH 1.640 05/19/2023: ALT 32; BUN 9; Creatinine, Ser 0.72; Potassium 4.5; Sodium 142    Lipid Panel    Component Value Date/Time   CHOL 185 05/19/2023 1057   TRIG 197 (H) 05/19/2023 1057   HDL 50 05/19/2023 1057   CHOLHDL 4.6 (H) 01/02/2023 1131   LDLCALC 101 (H) 05/19/2023 1057      Other studies Reviewed: Additional studies/ records that were reviewed today include:  Review of the above records demonstrates:       No data to display            ASSESSMENT AND PLAN:    ICD-10-CM   1. Chest pain due to myocardial ischemia, unspecified ischemic chest pain type  I25.9 diltiazem (CARDIZEM CD) 120 MG 24 hr capsule    lisinopril (ZESTRIL) 10 MG tablet    MYOCARDIAL PERFUSION IMAGING    PCV ECHOCARDIOGRAM COMPLETE   set up stress test echo    2. Benign essential hypertension  I10 diltiazem (CARDIZEM CD) 120 MG 24 hr capsule    lisinopril (ZESTRIL) 10 MG tablet    MYOCARDIAL PERFUSION IMAGING    PCV ECHOCARDIOGRAM COMPLETE     3. Mixed hyperlipidemia  E78.2 diltiazem (CARDIZEM CD) 120 MG 24 hr capsule    lisinopril (ZESTRIL) 10 MG tablet    MYOCARDIAL PERFUSION IMAGING  PCV ECHOCARDIOGRAM COMPLETE    4. Sinus tachycardia  R00.0 diltiazem (CARDIZEM CD) 120 MG 24 hr capsule    lisinopril (ZESTRIL) 10 MG tablet    MYOCARDIAL PERFUSION IMAGING    PCV ECHOCARDIOGRAM COMPLETE   add cardizem cd 120, change lisinopril  hctz 20/112.5 to 10 mg daily       Problem List Items Addressed This Visit       Cardiovascular and Mediastinum   Benign essential hypertension   Relevant Medications   diltiazem (CARDIZEM CD) 120 MG 24 hr capsule   lisinopril (ZESTRIL) 10 MG tablet   Other Relevant Orders   MYOCARDIAL PERFUSION IMAGING   PCV ECHOCARDIOGRAM COMPLETE     Other   Chest pain - Primary   Relevant Medications   diltiazem (CARDIZEM CD) 120 MG 24 hr capsule   lisinopril (ZESTRIL) 10 MG tablet   Other Relevant Orders   MYOCARDIAL PERFUSION IMAGING   PCV ECHOCARDIOGRAM COMPLETE   Hyperlipidemia   Relevant Medications   diltiazem (CARDIZEM CD) 120 MG 24 hr capsule   lisinopril (ZESTRIL) 10 MG tablet   Other Relevant Orders   MYOCARDIAL PERFUSION IMAGING   PCV ECHOCARDIOGRAM COMPLETE   Sinus tachycardia   Relevant Medications   diltiazem (CARDIZEM CD) 120 MG 24 hr capsule   lisinopril (ZESTRIL) 10 MG tablet   Other Relevant Orders   MYOCARDIAL PERFUSION IMAGING   PCV ECHOCARDIOGRAM COMPLETE       Disposition:   Return in about 3 weeks (around 08/14/2023) for echo, stress test and f/u.    Total time spent: 30 minutes  Signed,  Adrian Blackwater, MD  07/24/2023 11:47 AM    [facility]

## 2023-07-27 ENCOUNTER — Telehealth: Payer: Self-pay

## 2023-07-27 NOTE — Telephone Encounter (Signed)
Patient called asking for referral to endocrinologist states that her sugars are stilll out of wack

## 2023-07-28 ENCOUNTER — Other Ambulatory Visit: Payer: Self-pay | Admitting: Cardiology

## 2023-07-28 DIAGNOSIS — E1165 Type 2 diabetes mellitus with hyperglycemia: Secondary | ICD-10-CM

## 2023-07-31 NOTE — Telephone Encounter (Signed)
Patient Notified via MyChart Messgae

## 2023-08-23 ENCOUNTER — Other Ambulatory Visit: Payer: Self-pay | Admitting: Cardiology

## 2023-09-07 ENCOUNTER — Telehealth: Payer: Self-pay

## 2023-09-07 ENCOUNTER — Other Ambulatory Visit: Payer: Self-pay | Admitting: Cardiology

## 2023-09-07 MED ORDER — MUPIROCIN 2 % EX OINT: 1.0000 | TOPICAL_OINTMENT | Freq: Two times a day (BID) | CUTANEOUS | 1 refills | Status: DC

## 2023-09-07 NOTE — Telephone Encounter (Signed)
Patient called back stating that' she now is running a fever of 101 and wants to know if you can send her in doxycycline and if you think she's got some kind of infection since she has this spot on her arm that isnt healing

## 2023-09-07 NOTE — Telephone Encounter (Signed)
Patient called asking if you could call her in something for a sore that wont heal/ scab over in between her forearm and elbow or does patient need to make appt to see you?

## 2023-09-08 ENCOUNTER — Encounter: Payer: Self-pay | Admitting: Cardiology

## 2023-09-11 ENCOUNTER — Encounter: Payer: Self-pay | Admitting: Cardiology

## 2023-09-11 ENCOUNTER — Ambulatory Visit (INDEPENDENT_AMBULATORY_CARE_PROVIDER_SITE_OTHER): Payer: BLUE CROSS/BLUE SHIELD | Admitting: Cardiology

## 2023-09-11 DIAGNOSIS — L0390 Cellulitis, unspecified: Secondary | ICD-10-CM | POA: Insufficient documentation

## 2023-09-11 MED ORDER — DOXYCYCLINE HYCLATE 100 MG PO TABS: 100.0000 mg | ORAL_TABLET | Freq: Two times a day (BID) | ORAL | 0 refills | Status: AC

## 2023-09-11 NOTE — Progress Notes (Signed)
Established Patient Office Visit  Subjective:  Patient ID: Gabriella Young, female    DOB: 1961-06-16  Age: 62 y.o. MRN: [id number]  Chief Complaint  Patient presents with   Acute Visit    Fever, facial pain.    Patient in office for an acute visit, complaining of fever and facial pain. Patient states rash started last Wednesday, developed a fever the same day, 101.0. Fever broke on Friday. Patient states she developed fluid blisters on her forehead over the weekend. Fluid now below her eyes. Patient denies change in lotion, face wash, makeup, laundry detergent. Will send in doxycycline.     No other concerns at this time.   Past Medical History:  Diagnosis Date   Anemia    HX OF/ RESOLVED AFTER HYSTERECTOMY   Arthritis    HAND,FEET,KNEES   Bunion right   08.21.2014, surgery scheduled 9.26.14   Bursitis 06/20/2013   plantar aspect of hallux right   Congenital absence of one kidney    RIGHT KIDNEY NEVER DEVELOPED, REMOVED AT 62 YEARS OLD   Dental crowns present    VENEERS ON FRONT UPPER TEETH   Diabetes mellitus without complication (HCC)    High cholesterol    Hypertension    CONTROLLED ON MEDS   Legally blind    WEARS CONTACTS   Plantar fasciitis of right foot 05/23/2013   WITH LATERAL COMPENSATORY SYNDROME   Seasonal allergies    Sesamoiditis 06/20/2013   HALLUX RIGHT AND HALLUX INTERPHALANGEUM   Tachycardia    ON MEDS/ DR Milta Deiters    Vertigo    HX OF THREE YEARS AGO   Wears contact lenses     Past Surgical History:  Procedure Laterality Date   ABDOMINAL HYSTERECTOMY     BREAST BIOPSY Left 2013   ESOPHAGEAL DILATION N/A 07/07/2016   Procedure: ESOPHAGOGASTRODUODENOSCOPY (EGD);  Surgeon: Midge Minium, MD;  Location: [facility];  Service: Endoscopy;  Laterality: N/A;   KIDNEY SURGERY Right    REMOVED   PARTIAL HYSTERECTOMY     RHINOPLASTY     TOE SURGERY Right    screw in great toe   TONSILECTOMY, ADENOIDECTOMY, BILATERAL MYRINGOTOMY AND TUBES      TUBAL LIGATION      Social History   Socioeconomic History   Marital status: Married    Spouse name: [name]   Number of children: 2   Years of education: 16+   Highest education level: Not on file  Occupational History   Occupation: Unemployed  Tobacco Use   Smoking status: Never   Smokeless tobacco: Never  Vaping Use   Vaping status: Never Used  Substance and Sexual Activity   Alcohol use: No   Drug use: No   Sexual activity: Not Currently  Other Topics Concern   Not on file  Social History Narrative   Lives with husband, [name]   Caffeine use: 1 cup coffee 3x/week   Pepsi with meals sometimes    Social Determinants of Corporate investment banker Strain: Not on file  Food Insecurity: Not on file  Transportation Needs: Not on file  Physical Activity: Not on file  Stress: Not on file  Social Connections: Not on file  Intimate Partner Violence: Not on file    Family History  Problem Relation Age of Onset   Diabetes Mother    Dementia Mother    Diabetes Father    Heart attack Father    Diabetes Maternal Aunt    Breast cancer Paternal  Aunt 48   Diabetes Maternal Grandmother    Dementia Maternal Grandmother    Stroke Maternal Grandmother     Allergies  Allergen Reactions   Codeine Other (See Comments)    MAKES DROWSY/ NOT REALLY ALLERGY, DOESN'T LIKE THE FEELING   Sulfa Antibiotics Hives   Sulfasalazine Hives    Review of Systems  Constitutional:  Positive for fever.  HENT: Negative.    Eyes: Negative.   Respiratory: Negative.  Negative for shortness of breath.   Cardiovascular: Negative.  Negative for chest pain.  Gastrointestinal: Negative.  Negative for abdominal pain, constipation and diarrhea.  Genitourinary: Negative.   Musculoskeletal:  Negative for joint pain and myalgias.  Skin: Negative.   Neurological: Negative.  Negative for dizziness and headaches.  Endo/Heme/Allergies: Negative.   All other systems reviewed and are negative.       Objective:   BP 116/78   Pulse (!) 111   Ht 5\' 7"  (1.702 m)   Wt 178 lb 12.8 oz (81.1 kg)   SpO2 97%   BMI 28.00 kg/m   Vitals:   09/11/23 0936  BP: 116/78  Pulse: (!) 111  Height: 5\' 7"  (1.702 m)  Weight: 178 lb 12.8 oz (81.1 kg)  SpO2: 97%  BMI (Calculated): 28    Physical Exam Vitals and nursing note reviewed.  Constitutional:      Appearance: Normal appearance. She is normal weight.  HENT:     Head: Normocephalic and atraumatic.     Nose: Nose normal.     Mouth/Throat:     Mouth: Mucous membranes are moist.  Eyes:     Extraocular Movements: Extraocular movements intact.     Conjunctiva/sclera: Conjunctivae normal.     Pupils: Pupils are equal, round, and reactive to light.  Cardiovascular:     Rate and Rhythm: Normal rate and regular rhythm.     Pulses: Normal pulses.     Heart sounds: Normal heart sounds.  Pulmonary:     Effort: Pulmonary effort is normal.     Breath sounds: Normal breath sounds.  Abdominal:     General: Abdomen is flat. Bowel sounds are normal.     Palpations: Abdomen is soft.  Musculoskeletal:        General: Normal range of motion.     Cervical back: Normal range of motion.  Skin:    General: Skin is warm and dry.  Neurological:     General: No focal deficit present.     Mental Status: She is alert and oriented to person, place, and time.  Psychiatric:        Mood and Affect: Mood normal.        Behavior: Behavior normal.        Thought Content: Thought content normal.        Judgment: Judgment normal.      No results found for any visits on 09/11/23.  No results found for this or any previous visit (from the past 2160 hour(s)).    Assessment & Plan:  Doxycycline Notify office if symptoms worsen.   Problem List Items Addressed This Visit       Other   Cellulitis   Relevant Medications   doxycycline (VIBRA-TABS) 100 MG tablet    Return if symptoms worsen or fail to improve, for as scheduled.      Total time  spent: 25 minutes  Google, NP  09/11/2023   This document may have been prepared by [employer] Voice Recognition software and as  such may include unintentional dictation errors.

## 2023-09-22 ENCOUNTER — Ambulatory Visit: Payer: BLUE CROSS/BLUE SHIELD | Admitting: Cardiology

## 2023-10-02 ENCOUNTER — Ambulatory Visit: Payer: BLUE CROSS/BLUE SHIELD | Admitting: Cardiology

## 2023-10-09 ENCOUNTER — Other Ambulatory Visit: Payer: BLUE CROSS/BLUE SHIELD

## 2023-10-09 DIAGNOSIS — E782 Mixed hyperlipidemia: Secondary | ICD-10-CM

## 2023-10-09 DIAGNOSIS — I10 Essential (primary) hypertension: Secondary | ICD-10-CM

## 2023-10-09 DIAGNOSIS — E1165 Type 2 diabetes mellitus with hyperglycemia: Secondary | ICD-10-CM

## 2023-10-10 ENCOUNTER — Ambulatory Visit (INDEPENDENT_AMBULATORY_CARE_PROVIDER_SITE_OTHER): Payer: BLUE CROSS/BLUE SHIELD | Admitting: Cardiology

## 2023-10-10 ENCOUNTER — Encounter: Payer: Self-pay | Admitting: Cardiology

## 2023-10-10 VITALS — BP 100/74 | HR 116 | Ht 68.0 in | Wt 177.8 lb

## 2023-10-10 DIAGNOSIS — I10 Essential (primary) hypertension: Secondary | ICD-10-CM | POA: Diagnosis not present

## 2023-10-10 DIAGNOSIS — E118 Type 2 diabetes mellitus with unspecified complications: Secondary | ICD-10-CM

## 2023-10-10 DIAGNOSIS — E1165 Type 2 diabetes mellitus with hyperglycemia: Secondary | ICD-10-CM

## 2023-10-10 DIAGNOSIS — E782 Mixed hyperlipidemia: Secondary | ICD-10-CM

## 2023-10-10 LAB — CBC WITH DIFFERENTIAL/PLATELET
Basophils Absolute: 0 10*3/uL (ref 0.0–0.2)
Basos: 1 %
EOS (ABSOLUTE): 0 10*3/uL (ref 0.0–0.4)
Eos: 1 %
Hematocrit: 44 % (ref 34.0–46.6)
Hemoglobin: 14.3 g/dL (ref 11.1–15.9)
Immature Grans (Abs): 0 10*3/uL (ref 0.0–0.1)
Immature Granulocytes: 0 %
Lymphocytes Absolute: 1.6 10*3/uL (ref 0.7–3.1)
Lymphs: 38 %
MCH: 28.6 pg (ref 26.6–33.0)
MCHC: 32.5 g/dL (ref 31.5–35.7)
MCV: 88 fL (ref 79–97)
Monocytes Absolute: 0.3 10*3/uL (ref 0.1–0.9)
Monocytes: 7 %
Neutrophils Absolute: 2.3 10*3/uL (ref 1.4–7.0)
Neutrophils: 53 %
Platelets: 164 10*3/uL (ref 150–450)
RBC: 5 x10E6/uL (ref 3.77–5.28)
RDW: 12.9 % (ref 11.7–15.4)
WBC: 4.2 10*3/uL (ref 3.4–10.8)

## 2023-10-10 LAB — CMP14+EGFR
ALT: 26 [IU]/L (ref 0–32)
AST: 27 [IU]/L (ref 0–40)
Albumin: 4.3 g/dL (ref 3.9–4.9)
Alkaline Phosphatase: 138 [IU]/L — ABNORMAL HIGH (ref 44–121)
BUN/Creatinine Ratio: 14 (ref 12–28)
BUN: 11 mg/dL (ref 8–27)
Bilirubin Total: 0.9 mg/dL (ref 0.0–1.2)
CO2: 26 mmol/L (ref 20–29)
Calcium: 9.6 mg/dL (ref 8.7–10.3)
Chloride: 99 mmol/L (ref 96–106)
Creatinine, Ser: 0.81 mg/dL (ref 0.57–1.00)
Globulin, Total: 2.8 g/dL (ref 1.5–4.5)
Glucose: 286 mg/dL — ABNORMAL HIGH (ref 70–99)
Potassium: 4.1 mmol/L (ref 3.5–5.2)
Sodium: 140 mmol/L (ref 134–144)
Total Protein: 7.1 g/dL (ref 6.0–8.5)
eGFR: 83 mL/min/{1.73_m2} (ref >59)

## 2023-10-10 LAB — LIPID PANEL
Chol/HDL Ratio: 4.3 {ratio} (ref 0.0–4.4)
Cholesterol, Total: 188 mg/dL (ref 100–199)
HDL: 44 mg/dL (ref >39)
LDL Chol Calc (NIH): 114 mg/dL — ABNORMAL HIGH (ref 0–99)
Triglycerides: 168 mg/dL — ABNORMAL HIGH (ref 0–149)
VLDL Cholesterol Cal: 30 mg/dL (ref 5–40)

## 2023-10-10 LAB — HEMOGLOBIN A1C
Est. average glucose Bld gHb Est-mCnc: 283 mg/dL
Hgb A1c MFr Bld: 11.5 % — ABNORMAL HIGH (ref 4.8–5.6)

## 2023-10-10 NOTE — Progress Notes (Signed)
Established Patient Office Visit  Subjective:  Patient ID: Gabriella Young, female    DOB: 11/19/1960  Age: 62 y.o. MRN: [id number]  Chief Complaint  Patient presents with   Follow-up    4 month follow up, discuss lab results.    Patient in office for 4 month follow up, discuss recent lab work. Patient reports feeling well over all. States she is under stress at home, going through a divorce.  Discussed recent lab work. Hgb A1c elevated. Patient has an appointment with endocrinology tomorrow.  Patient heart rate elevated, not taking diltiazem due to headaches. Patient has an appointment on 10/30/2023.     No other concerns at this time.   Past Medical History:  Diagnosis Date   Anemia    HX OF/ RESOLVED AFTER HYSTERECTOMY   Arthritis    HAND,FEET,KNEES   Bunion right   08.21.2014, surgery scheduled 9.26.14   Bursitis 06/20/2013   plantar aspect of hallux right   Congenital absence of one kidney    RIGHT KIDNEY NEVER DEVELOPED, REMOVED AT 62 YEARS OLD   Dental crowns present    VENEERS ON FRONT UPPER TEETH   Diabetes mellitus without complication (HCC)    High cholesterol    Hypertension    CONTROLLED ON MEDS   Legally blind    WEARS CONTACTS   Plantar fasciitis of right foot 05/23/2013   WITH LATERAL COMPENSATORY SYNDROME   Seasonal allergies    Sesamoiditis 06/20/2013   HALLUX RIGHT AND HALLUX INTERPHALANGEUM   Tachycardia    ON MEDS/ DR Milta Deiters    Vertigo    HX OF THREE YEARS AGO   Wears contact lenses     Past Surgical History:  Procedure Laterality Date   ABDOMINAL HYSTERECTOMY     BREAST BIOPSY Left 2013   ESOPHAGEAL DILATION N/A 07/07/2016   Procedure: ESOPHAGOGASTRODUODENOSCOPY (EGD);  Surgeon: Midge Minium, MD;  Location: [facility];  Service: Endoscopy;  Laterality: N/A;   KIDNEY SURGERY Right    REMOVED   PARTIAL HYSTERECTOMY     RHINOPLASTY     TOE SURGERY Right    screw in great toe   TONSILECTOMY, ADENOIDECTOMY, BILATERAL MYRINGOTOMY  AND TUBES     TUBAL LIGATION      Social History   Socioeconomic History   Marital status: Married    Spouse name: [name]   Number of children: 2   Years of education: 16+   Highest education level: Not on file  Occupational History   Occupation: Unemployed  Tobacco Use   Smoking status: Never   Smokeless tobacco: Never  Vaping Use   Vaping status: Never Used  Substance and Sexual Activity   Alcohol use: No   Drug use: No   Sexual activity: Not Currently  Other Topics Concern   Not on file  Social History Narrative   Lives with husband, [name]   Caffeine use: 1 cup coffee 3x/week   Pepsi with meals sometimes    Social Determinants of Corporate investment banker Strain: Not on file  Food Insecurity: Not on file  Transportation Needs: Not on file  Physical Activity: Not on file  Stress: Not on file  Social Connections: Not on file  Intimate Partner Violence: Not on file    Family History  Problem Relation Age of Onset   Diabetes Mother    Dementia Mother    Diabetes Father    Heart attack Father    Diabetes Maternal Aunt  Breast cancer Paternal Aunt 47   Diabetes Maternal Grandmother    Dementia Maternal Grandmother    Stroke Maternal Grandmother     Allergies  Allergen Reactions   Codeine Other (See Comments)    MAKES DROWSY/ NOT REALLY ALLERGY, DOESN'T LIKE THE FEELING   Sulfa Antibiotics Hives   Sulfasalazine Hives    Outpatient Medications Prior to Visit  Medication Sig Note   clonazePAM (KLONOPIN) 0.5 MG tablet Take by mouth.    Continuous Glucose Sensor (FREESTYLE LIBRE 3 SENSOR) MISC 2 each by Does not apply route every 14 (fourteen) days. Apply to arm every 14 days for sugar readings    fluticasone (FLONASE) 50 MCG/ACT nasal spray Place 2 sprays into both nostrils 2 (two) times daily.    gabapentin (NEURONTIN) 100 MG capsule Take 1 capsule (100 mg total) by mouth daily.    insulin lispro (HUMALOG KWIKPEN) 100 UNIT/ML KwikPen Inject 7 Units  into the skin 3 (three) times daily.    Insulin Pen Needle 32G X 4 MM MISC 1 each by Does not apply route in the morning, at noon, in the evening, and at bedtime.    lisinopril (ZESTRIL) 10 MG tablet Take 1 tablet (10 mg total) by mouth daily.    Multiple Vitamin (MULTIVITAMIN) tablet Take 1 tablet by mouth daily.    mupirocin ointment (BACTROBAN) 2 % Apply 1 Application topically 2 (two) times daily.    pravastatin (PRAVACHOL) 40 MG tablet TAKE 1 TABLET BY MOUTH EVERY EVENING    venlafaxine XR (EFFEXOR-XR) 37.5 MG 24 hr capsule Take 1 capsule (37.5 mg total) by mouth daily.    [DISCONTINUED] diltiazem (CARDIZEM CD) 120 MG 24 hr capsule Take 1 capsule (120 mg total) by mouth daily. (Patient not taking: Reported on 09/11/2023) 10/10/2023: HA   No facility-administered medications prior to visit.    Review of Systems  Constitutional: Negative.   HENT: Negative.    Eyes: Negative.   Respiratory: Negative.  Negative for shortness of breath.   Cardiovascular: Negative.  Negative for chest pain.  Gastrointestinal: Negative.  Negative for abdominal pain, constipation and diarrhea.  Genitourinary: Negative.   Musculoskeletal:  Negative for joint pain and myalgias.  Skin: Negative.   Neurological: Negative.  Negative for dizziness and headaches.  Endo/Heme/Allergies: Negative.   All other systems reviewed and are negative.      Objective:   BP 100/74   Pulse (!) 116   Ht 5\' 8"  (1.727 m)   Wt 177 lb 12.8 oz (80.6 kg)   SpO2 94%   BMI 27.03 kg/m   Vitals:   10/10/23 1130  BP: 100/74  Pulse: (!) 116  Height: 5\' 8"  (1.727 m)  Weight: 177 lb 12.8 oz (80.6 kg)  SpO2: 94%  BMI (Calculated): 27.04    Physical Exam Vitals and nursing note reviewed.  Constitutional:      Appearance: Normal appearance. She is normal weight.  HENT:     Head: Normocephalic and atraumatic.     Nose: Nose normal.     Mouth/Throat:     Mouth: Mucous membranes are moist.  Eyes:     Extraocular  Movements: Extraocular movements intact.     Conjunctiva/sclera: Conjunctivae normal.     Pupils: Pupils are equal, round, and reactive to light.  Cardiovascular:     Rate and Rhythm: Normal rate and regular rhythm.     Pulses: Normal pulses.     Heart sounds: Normal heart sounds.  Pulmonary:     Effort: Pulmonary  effort is normal.     Breath sounds: Normal breath sounds.  Abdominal:     General: Abdomen is flat. Bowel sounds are normal.     Palpations: Abdomen is soft.  Musculoskeletal:        General: Normal range of motion.     Cervical back: Normal range of motion.  Skin:    General: Skin is warm and dry.  Neurological:     General: No focal deficit present.     Mental Status: She is alert and oriented to person, place, and time.  Psychiatric:        Mood and Affect: Mood normal.        Behavior: Behavior normal.        Thought Content: Thought content normal.        Judgment: Judgment normal.      No results found for any visits on 10/10/23.  Recent Results (from the past 2160 hour(s))  Hemoglobin A1c     Status: Abnormal   Collection Time: 10/09/23 10:03 AM  Result Value Ref Range   Hgb A1c MFr Bld 11.5 (H) 4.8 - 5.6 %    Comment:          Prediabetes: 5.7 - 6.4          Diabetes: >6.4          Glycemic control for adults with diabetes: <7.0    Est. average glucose Bld gHb Est-mCnc 283 mg/dL  [id number]     Status: Abnormal   Collection Time: 10/09/23 10:03 AM  Result Value Ref Range   Glucose 286 (H) 70 - 99 mg/dL   BUN 11 8 - 27 mg/dL   Creatinine, Ser 4.09 0.57 - 1.00 mg/dL   eGFR 83 >81 XB/JYN/8.29   BUN/Creatinine Ratio 14 12 - 28   Sodium 140 134 - 144 mmol/L   Potassium 4.1 3.5 - 5.2 mmol/L   Chloride 99 96 - 106 mmol/L   CO2 26 20 - 29 mmol/L   Calcium 9.6 8.7 - 10.3 mg/dL   Total Protein 7.1 6.0 - 8.5 g/dL   Albumin 4.3 3.9 - 4.9 g/dL   Globulin, Total 2.8 1.5 - 4.5 g/dL   Bilirubin Total 0.9 0.0 - 1.2 mg/dL   Alkaline Phosphatase 138 (H) 44 -  121 IU/L   AST 27 0 - 40 IU/L   ALT 26 0 - 32 IU/L  CBC with Differential/Platelet     Status: None   Collection Time: 10/09/23 10:03 AM  Result Value Ref Range   WBC 4.2 3.4 - 10.8 x10E3/uL   RBC 5.00 3.77 - 5.28 x10E6/uL   Hemoglobin 14.3 11.1 - 15.9 g/dL   Hematocrit 56.2 13.0 - 46.6 %   MCV 88 79 - 97 fL   MCH 28.6 26.6 - 33.0 pg   MCHC 32.5 31.5 - 35.7 g/dL   RDW 86.5 78.4 - 69.6 %   Platelets 164 150 - 450 x10E3/uL   Neutrophils 53 Not Estab. %   Lymphs 38 Not Estab. %   Monocytes 7 Not Estab. %   Eos 1 Not Estab. %   Basos 1 Not Estab. %   Neutrophils Absolute 2.3 1.4 - 7.0 x10E3/uL   Lymphocytes Absolute 1.6 0.7 - 3.1 x10E3/uL   Monocytes Absolute 0.3 0.1 - 0.9 x10E3/uL   EOS (ABSOLUTE) 0.0 0.0 - 0.4 x10E3/uL   Basophils Absolute 0.0 0.0 - 0.2 x10E3/uL   Immature Granulocytes 0 Not Estab. %   Immature Grans (Abs)  0.0 0.0 - 0.1 x10E3/uL  Lipid panel     Status: Abnormal   Collection Time: 10/09/23 10:03 AM  Result Value Ref Range   Cholesterol, Total 188 100 - 199 mg/dL   Triglycerides 629 (H) 0 - 149 mg/dL   HDL 44 >52 mg/dL   VLDL Cholesterol Cal 30 5 - 40 mg/dL   LDL Chol Calc (NIH) 841 (H) 0 - 99 mg/dL   Chol/HDL Ratio 4.3 0.0 - 4.4 ratio    Comment:                                   T. Chol/HDL Ratio                                             Men  Women                               1/2 Avg.Risk  3.4    3.3                                   Avg.Risk  5.0    4.4                                2X Avg.Risk  9.6    7.1                                3X Avg.Risk 23.4   11.0       Assessment & Plan:  Continue same medications. Keep appointments with specialists.   Problem List Items Addressed This Visit       Cardiovascular and Mediastinum   Benign essential hypertension - Primary     Endocrine   Poorly controlled type 2 diabetes mellitus with complication (HCC)     Other   Hyperlipidemia    Return in about 4 months (around 02/08/2024) for with  fasting labs prior.   Total time spent: 25 minutes  Google, NP  10/10/2023   This document may have been prepared by Dragon Voice Recognition software and as such may include unintentional dictation errors.

## 2023-10-30 ENCOUNTER — Ambulatory Visit: Payer: BLUE CROSS/BLUE SHIELD | Admitting: Cardiovascular Disease

## 2023-11-17 ENCOUNTER — Other Ambulatory Visit: Payer: Self-pay | Admitting: Cardiology

## 2023-11-27 ENCOUNTER — Ambulatory Visit: Payer: BLUE CROSS/BLUE SHIELD | Admitting: Cardiovascular Disease

## 2023-12-07 ENCOUNTER — Encounter: Payer: Self-pay | Admitting: Cardiology

## 2023-12-07 ENCOUNTER — Ambulatory Visit (INDEPENDENT_AMBULATORY_CARE_PROVIDER_SITE_OTHER): Payer: Self-pay | Admitting: Cardiology

## 2023-12-07 VITALS — BP 134/80 | HR 96 | Ht 68.0 in | Wt 185.0 lb

## 2023-12-07 DIAGNOSIS — J0140 Acute pansinusitis, unspecified: Secondary | ICD-10-CM | POA: Insufficient documentation

## 2023-12-07 MED ORDER — AMOXICILLIN-POT CLAVULANATE 875-125 MG PO TABS: 1.0000 | ORAL_TABLET | Freq: Two times a day (BID) | ORAL | 0 refills | Status: AC

## 2023-12-07 NOTE — Progress Notes (Signed)
 Established Patient Office Visit  Subjective:  Patient ID: Gabriella Young, female    DOB: [birth year]  Age: 63 y.o. MRN: [id number]  Chief Complaint  Patient presents with   Acute Visit    Wheezing, Congestion, Sore Throat, Fatigue    Patient in office for an acute visit complaining of wheezing, congestion, sore throat, PND, and fatigue for the past week. Covid test negative 3 days ago. No wheezing noted on exam. Patient states wheezing occurs when she wakes in the morning prior to getting out of bed. Will send in Augmentin . Continue to use Flonase as prescribed. Recommend Mucinex, rest and increase fluid intake.   URI  This is a new problem. The current episode started in the past 7 days. The problem has been waxing and waning. There has been no fever. Associated symptoms include congestion, coughing, headaches, rhinorrhea, sinus pain, a sore throat and wheezing. Pertinent negatives include no abdominal pain, chest pain, diarrhea or joint pain. She has tried NSAIDs for the symptoms. The treatment provided no relief.    No other concerns at this time.   Past Medical History:  Diagnosis Date   Anemia    HX OF/ RESOLVED AFTER HYSTERECTOMY   Arthritis    HAND,FEET,KNEES   Bunion right   08.21.2014, surgery scheduled 9.26.14   Bursitis 06/20/2013   plantar aspect of hallux right   Congenital absence of one kidney    RIGHT KIDNEY NEVER DEVELOPED, REMOVED AT 63 YEARS OLD   Dental crowns present    VENEERS ON FRONT UPPER TEETH   Diabetes mellitus without complication (HCC)    High cholesterol    Hypertension    CONTROLLED ON MEDS   Legally blind    WEARS CONTACTS   Plantar fasciitis of right foot 05/23/2013   WITH LATERAL COMPENSATORY SYNDROME   Seasonal allergies    Sesamoiditis 06/20/2013   HALLUX RIGHT AND HALLUX INTERPHALANGEUM   Tachycardia    ON MEDS/ DR GORMAN BATHE    Vertigo    HX OF THREE YEARS AGO   Wears contact lenses     Past Surgical History:  Procedure  Laterality Date   ABDOMINAL HYSTERECTOMY     BREAST BIOPSY Left 2013   ESOPHAGEAL DILATION N/A 07/07/2016   Procedure: ESOPHAGOGASTRODUODENOSCOPY (EGD);  Surgeon: Rogelia Copping, MD;  Location: [facility];  Service: Endoscopy;  Laterality: N/A;   KIDNEY SURGERY Right    REMOVED   PARTIAL HYSTERECTOMY     RHINOPLASTY     TOE SURGERY Right    screw in great toe   TONSILECTOMY, ADENOIDECTOMY, BILATERAL MYRINGOTOMY AND TUBES     TUBAL LIGATION      Social History   Socioeconomic History   Marital status: Married    Spouse name: Alm   Number of children: 2   Years of education: 16+   Highest education level: Not on file  Occupational History   Occupation: Unemployed  Tobacco Use   Smoking status: Never   Smokeless tobacco: Never  Vaping Use   Vaping status: Never Used  Substance and Sexual Activity   Alcohol use: No   Drug use: No   Sexual activity: Not Currently  Other Topics Concern   Not on file  Social History Narrative   Lives with husband, Alm   Caffeine use: 1 cup coffee 3x/week   Pepsi with meals sometimes    Social Drivers of Corporate Investment Banker Strain: Not on file  Food Insecurity: Not on file  Transportation Needs: Not on file  Physical Activity: Not on file  Stress: Not on file  Social Connections: Not on file  Intimate Partner Violence: Not on file    Family History  Problem Relation Age of Onset   Diabetes Mother    Dementia Mother    Diabetes Father    Heart attack Father    Diabetes Maternal Aunt    Breast cancer Paternal Aunt 75   Diabetes Maternal Grandmother    Dementia Maternal Grandmother    Stroke Maternal Grandmother     Allergies  Allergen Reactions   Codeine Other (See Comments)    MAKES DROWSY/ NOT REALLY ALLERGY, DOESN'T LIKE THE FEELING   Sulfa Antibiotics Hives   Sulfasalazine Hives    Outpatient Medications Prior to Visit  Medication Sig   clonazePAM (KLONOPIN) 0.5 MG tablet Take by mouth.    Continuous Glucose Sensor (FREESTYLE LIBRE 3 SENSOR) MISC 2 each by Does not apply route every 14 (fourteen) days. Apply to arm every 14 days for sugar readings   fluticasone (FLONASE) 50 MCG/ACT nasal spray Place 2 sprays into both nostrils 2 (two) times daily.   gabapentin  (NEURONTIN ) 100 MG capsule Take 1 capsule (100 mg total) by mouth daily.   insulin  lispro (HUMALOG  KWIKPEN) 100 UNIT/ML KwikPen Inject 7 Units into the skin 3 (three) times daily.   Insulin  Pen Needle 32G X 4 MM MISC 1 each by Does not apply route in the morning, at noon, in the evening, and at bedtime.   lisinopril  (ZESTRIL ) 10 MG tablet Take 1 tablet (10 mg total) by mouth daily.   Multiple Vitamin (MULTIVITAMIN) tablet Take 1 tablet by mouth daily.   mupirocin  ointment (BACTROBAN ) 2 % Apply 1 Application topically 2 (two) times daily.   pravastatin (PRAVACHOL) 40 MG tablet TAKE 1 TABLET BY MOUTH EVERY EVENING   venlafaxine  XR (EFFEXOR -XR) 37.5 MG 24 hr capsule Take 1 capsule (37.5 mg total) by mouth daily.   No facility-administered medications prior to visit.    Review of Systems  Constitutional:  Positive for malaise/fatigue.  HENT:  Positive for congestion, rhinorrhea, sinus pain and sore throat.   Eyes: Negative.   Respiratory:  Positive for cough, sputum production, shortness of breath and wheezing.   Cardiovascular: Negative.  Negative for chest pain.  Gastrointestinal: Negative.  Negative for abdominal pain, constipation and diarrhea.  Genitourinary: Negative.   Musculoskeletal:  Negative for joint pain and myalgias.  Skin: Negative.   Neurological:  Positive for headaches. Negative for dizziness.  Endo/Heme/Allergies: Negative.   All other systems reviewed and are negative.      Objective:   BP 134/80   Pulse 96   Ht 5' 8 (1.727 m)   Wt 185 lb (83.9 kg)   SpO2 94%   BMI 28.13 kg/m   Vitals:   12/07/23 1330  BP: 134/80  Pulse: 96  Height: 5' 8 (1.727 m)  Weight: 185 lb (83.9 kg)  SpO2:  94%  BMI (Calculated): 28.14    Physical Exam Vitals and nursing note reviewed.  Constitutional:      Appearance: Normal appearance. She is normal weight.  HENT:     Head: Normocephalic and atraumatic.     Nose: Nose normal.     Mouth/Throat:     Mouth: Mucous membranes are moist.  Eyes:     Extraocular Movements: Extraocular movements intact.     Conjunctiva/sclera: Conjunctivae normal.     Pupils: Pupils are equal, round, and reactive to light.  Cardiovascular:     Rate and Rhythm: Normal rate and regular rhythm.     Pulses: Normal pulses.     Heart sounds: Normal heart sounds.  Pulmonary:     Effort: Pulmonary effort is normal. No respiratory distress.     Breath sounds: Normal breath sounds. No stridor. No wheezing, rhonchi or rales.  Abdominal:     General: Abdomen is flat. Bowel sounds are normal.     Palpations: Abdomen is soft.  Musculoskeletal:        General: Normal range of motion.     Cervical back: Normal range of motion.  Skin:    General: Skin is warm and dry.  Neurological:     General: No focal deficit present.     Mental Status: She is alert and oriented to person, place, and time.  Psychiatric:        Mood and Affect: Mood normal.        Behavior: Behavior normal.        Thought Content: Thought content normal.        Judgment: Judgment normal.      No results found for any visits on 12/07/23.  Recent Results (from the past 2160 hours)  Hemoglobin A1c     Status: Abnormal   Collection Time: 10/09/23 10:03 AM  Result Value Ref Range   Hgb A1c MFr Bld 11.5 (H) 4.8 - 5.6 %    Comment:          Prediabetes: 5.7 - 6.4          Diabetes: >6.4          Glycemic control for adults with diabetes: <7.0    Est. average glucose Bld gHb Est-mCnc 283 mg/dL  [id number]     Status: Abnormal   Collection Time: 10/09/23 10:03 AM  Result Value Ref Range   Glucose 286 (H) 70 - 99 mg/dL   BUN 11 8 - 27 mg/dL   Creatinine, Ser 9.18 0.57 - 1.00 mg/dL   eGFR  83 >40 fO/fpw/8.26   BUN/Creatinine Ratio 14 12 - 28   Sodium 140 134 - 144 mmol/L   Potassium 4.1 3.5 - 5.2 mmol/L   Chloride 99 96 - 106 mmol/L   CO2 26 20 - 29 mmol/L   Calcium 9.6 8.7 - 10.3 mg/dL   Total Protein 7.1 6.0 - 8.5 g/dL   Albumin 4.3 3.9 - 4.9 g/dL   Globulin, Total 2.8 1.5 - 4.5 g/dL   Bilirubin Total 0.9 0.0 - 1.2 mg/dL   Alkaline Phosphatase 138 (H) 44 - 121 IU/L   AST 27 0 - 40 IU/L   ALT 26 0 - 32 IU/L  CBC with Differential/Platelet     Status: None   Collection Time: 10/09/23 10:03 AM  Result Value Ref Range   WBC 4.2 3.4 - 10.8 x10E3/uL   RBC 5.00 3.77 - 5.28 x10E6/uL   Hemoglobin 14.3 11.1 - 15.9 g/dL   Hematocrit 55.9 65.9 - 46.6 %   MCV 88 79 - 97 fL   MCH 28.6 26.6 - 33.0 pg   MCHC 32.5 31.5 - 35.7 g/dL   RDW 87.0 88.2 - 84.5 %   Platelets 164 150 - 450 x10E3/uL   Neutrophils 53 Not Estab. %   Lymphs 38 Not Estab. %   Monocytes 7 Not Estab. %   Eos 1 Not Estab. %   Basos 1 Not Estab. %   Neutrophils Absolute 2.3 1.4 - 7.0 x10E3/uL  Lymphocytes Absolute 1.6 0.7 - 3.1 x10E3/uL   Monocytes Absolute 0.3 0.1 - 0.9 x10E3/uL   EOS (ABSOLUTE) 0.0 0.0 - 0.4 x10E3/uL   Basophils Absolute 0.0 0.0 - 0.2 x10E3/uL   Immature Granulocytes 0 Not Estab. %   Immature Grans (Abs) 0.0 0.0 - 0.1 x10E3/uL  Lipid panel     Status: Abnormal   Collection Time: 10/09/23 10:03 AM  Result Value Ref Range   Cholesterol, Total 188 100 - 199 mg/dL   Triglycerides 831 (H) 0 - 149 mg/dL   HDL 44 >60 mg/dL   VLDL Cholesterol Cal 30 5 - 40 mg/dL   LDL Chol Calc (NIH) 885 (H) 0 - 99 mg/dL   Chol/HDL Ratio 4.3 0.0 - 4.4 ratio    Comment:                                   T. Chol/HDL Ratio                                             Men  Women                               1/2 Avg.Risk  3.4    3.3                                   Avg.Risk  5.0    4.4                                2X Avg.Risk  9.6    7.1                                3X Avg.Risk 23.4   11.0        Assessment & Plan:  Augmentin  Flonase Mucinex Rest Increase water intake  Problem List Items Addressed This Visit       Respiratory   Acute non-recurrent pansinusitis - Primary   Relevant Medications   amoxicillin -clavulanate (AUGMENTIN ) 875-125 MG tablet    Return if symptoms worsen or fail to improve.   Total time spent: 25 minutes  Google, NP  12/07/2023   This document may have been prepared by Dragon Voice Recognition software and as such may include unintentional dictation errors.

## 2023-12-11 ENCOUNTER — Other Ambulatory Visit: Payer: Self-pay | Admitting: Cardiology

## 2023-12-11 ENCOUNTER — Telehealth: Payer: Self-pay

## 2023-12-11 MED ORDER — ALBUTEROL SULFATE HFA 108 (90 BASE) MCG/ACT IN AERS: 2.0000 | INHALATION_SPRAY | Freq: Four times a day (QID) | RESPIRATORY_TRACT | 2 refills | Status: DC | PRN

## 2023-12-11 NOTE — Telephone Encounter (Signed)
 Patient called asking for albuterol  inhaler to be called in  states she feels like she's wheezing more over the weekend than she did on friday

## 2023-12-13 ENCOUNTER — Telehealth: Payer: Self-pay | Admitting: Cardiology

## 2023-12-13 NOTE — Telephone Encounter (Signed)
Patient left VM that she is having a lot of SOB, especially when she lays down or is walking. What can we do for her?

## 2023-12-14 ENCOUNTER — Other Ambulatory Visit: Payer: Self-pay | Admitting: Cardiology

## 2023-12-14 DIAGNOSIS — R0602 Shortness of breath: Secondary | ICD-10-CM

## 2023-12-14 MED ORDER — METHYLPREDNISOLONE 4 MG PO TBPK: ORAL_TABLET | ORAL | 0 refills | Status: DC

## 2023-12-14 NOTE — Telephone Encounter (Signed)
Patient  called this AM asking for  either a refill on her abx or for a stronger abx to be called in, she states at night she is still SOB and early in the am

## 2023-12-18 ENCOUNTER — Telehealth: Payer: Self-pay | Admitting: Cardiology

## 2023-12-18 NOTE — Telephone Encounter (Signed)
 Patient is stating that she's still having trouble esp at night she is requesting to get a zpack, she doesn't have insurance so getting the cxr is not an option really at the moment

## 2023-12-18 NOTE — Telephone Encounter (Signed)
 Patient called stating that she doesn't feel like the albuterol & prednisone isn't working, because at night she is having more wheezing & crackling when she lays down. 2 more doses of the prednisone left. Patient states she feel like her chest is tight or heavy. Can we call something else in for her.

## 2023-12-19 ENCOUNTER — Other Ambulatory Visit: Payer: Self-pay | Admitting: Cardiology

## 2023-12-19 MED ORDER — AZITHROMYCIN 250 MG PO TABS: ORAL_TABLET | ORAL | 0 refills | Status: AC

## 2023-12-19 NOTE — Progress Notes (Signed)
 Marland Kitchen

## 2023-12-19 NOTE — Telephone Encounter (Signed)
 Informed via Mychart message

## 2024-01-02 ENCOUNTER — Emergency Department: Payer: Self-pay

## 2024-01-02 ENCOUNTER — Emergency Department
Admission: EM | Admit: 2024-01-02 | Discharge: 2024-01-02 | Disposition: A | Discharge status: Home / Self Care | Payer: Self-pay | Attending: Student in an Organized Health Care Education/Training Program | Admitting: Student in an Organized Health Care Education/Training Program

## 2024-01-02 ENCOUNTER — Encounter: Payer: Self-pay | Admitting: Intensive Care

## 2024-01-02 ENCOUNTER — Other Ambulatory Visit: Payer: Self-pay

## 2024-01-02 DIAGNOSIS — J40 Bronchitis, not specified as acute or chronic: Secondary | ICD-10-CM | POA: Insufficient documentation

## 2024-01-02 DIAGNOSIS — J101 Influenza due to other identified influenza virus with other respiratory manifestations: Secondary | ICD-10-CM | POA: Insufficient documentation

## 2024-01-02 LAB — RESP PANEL BY RT-PCR (RSV, FLU A&B, COVID)  RVPGX2
Influenza A by PCR: POSITIVE — AB
Influenza B by PCR: NEGATIVE
Resp Syncytial Virus by PCR: NEGATIVE
SARS Coronavirus 2 by RT PCR: NEGATIVE

## 2024-01-02 MED ORDER — ONDANSETRON 4 MG PO TBDP: 4.0000 mg | ORAL_TABLET | Freq: Three times a day (TID) | ORAL | 0 refills | Status: AC | PRN

## 2024-01-02 NOTE — ED Provider Notes (Signed)
 [facility] Emergency Department Provider Note     Event Date/Time   First MD Initiated Contact with Patient 01/02/24 1228     (approximate)   History   Fever   HPI  Gabriella Young is a 63 y.o. female presents to the ED for evaluation of ongoing cough and congestion. She had been treated by her PCP with a course of antibiotics including azithromycin and a prednisone taper.  She presents to the ED for evaluation of her symptoms.  Physical Exam   Triage Vital Signs: ED Triage Vitals  Encounter Vitals Group     BP 01/02/24 1214 (!) 149/91     Systolic BP Percentile --      Diastolic BP Percentile --      Pulse Rate 01/02/24 1214 (!) 111     Resp 01/02/24 1214 20     Temp 01/02/24 1214 98.7 F (37.1 C)     Temp Source 01/02/24 1214 Oral     SpO2 01/02/24 1214 94 %     Weight 01/02/24 1215 180 lb (81.6 kg)     Height 01/02/24 1215 5\' 8"  (1.727 m)     Head Circumference --      Peak Flow --      Pain Score 01/02/24 1214 8     Pain Loc --      Pain Education --      Exclude from Growth Chart --     Most recent vital signs: Vitals:   01/02/24 1214 01/02/24 1411  BP: (!) 149/91 (!) 144/89  Pulse: (!) 111 (!) 105  Resp: 20 20  Temp: 98.7 F (37.1 C) 98.6 F (37 C)  SpO2: 94% 95%    General Awake, no distress. NAD HEENT NCAT. PERRL. EOMI. No rhinorrhea. Mucous membranes are moist.  CV:  Good peripheral perfusion.  RESP:  Normal effort.  ABD:  No distention.    ED Results / Procedures / Treatments   Labs (all labs ordered are listed, but only abnormal results are displayed) Labs Reviewed  RESP PANEL BY RT-PCR (RSV, FLU A&B, COVID)  [id number] - Abnormal; Notable for the following components:      Result Value   Influenza A by PCR POSITIVE (*)    All other components within normal limits    EKG   RADIOLOGY  I personally viewed and evaluated these images as part of my medical decision making, as well as reviewing the written  report by the radiologist.  ED Provider Interpretation: atelectasis versus infiltrate left lingula/base  IMPRESSION: Trace pleural effusions with streaky atelectasis or minimal infiltrate at lingula and left base.   PROCEDURES:  Critical Care performed: No  Procedures   MEDICATIONS ORDERED IN ED: Medications - No data to display   IMPRESSION / MDM / ASSESSMENT AND PLAN / ED COURSE  I reviewed the triage vital signs and the nursing notes.                              Differential diagnosis includes, but is not limited to, covid, flu, RSV, viral URI, CAP, bronchitis  Patient's presentation is most consistent with acute complicated illness / injury requiring diagnostic workup.  Patient's diagnosis is consistent with influenza A which is confirmed on PCR.  Patient discharged with instructions to take OTC meds for symptom relief.  She declined Tamiflu at this time.  Chest x-ray results are pending at the time of  her disposition, she requested to leave ahead of results which have been taking longer than typical.  Patient will be discharged home with prescriptions for Zofran.  She may also take OTC Delsym for additional cough relief as well as her previously prescribed albuterol inhaler.  Patient is to follow up with her primary provider as discussed, as needed or otherwise directed. Patient is given ED precautions to return to the ED for any worsening or new symptoms.  FINAL CLINICAL IMPRESSION(S) / ED DIAGNOSES   Final diagnoses:  Influenza A  Bronchitis     Rx / DC Orders   ED Discharge Orders          Ordered    ondansetron (ZOFRAN-ODT) 4 MG disintegrating tablet  Every 8 hours PRN        01/02/24 1401             Note:  This document was prepared using Dragon voice recognition software and may include unintentional dictation errors.    Lissa Hoard, PA-C 01/06/24 1717    Willy Eddy, MD 01/07/24 [phone]

## 2024-01-02 NOTE — Discharge Instructions (Signed)
 Your viral panel test confirms influenza.  Your x-ray result is still pending.  We will treat you for a influenza and acute viral bronchitis.  Continue with your previously prescribed medications.  Consider adding a daily allergy medicine and anti-inflammatory for chest wall pain.  Follow-up with your primary provider or return to ED if needed.

## 2024-01-02 NOTE — ED Triage Notes (Signed)
 Pt c/o cough, congestion and fever.   Reports her pcp put her on amoxicillin, prednisone and zpack recently.

## 2024-01-08 ENCOUNTER — Telehealth: Payer: Self-pay

## 2024-01-08 ENCOUNTER — Other Ambulatory Visit: Payer: Self-pay | Admitting: Internal Medicine

## 2024-01-08 DIAGNOSIS — R052 Subacute cough: Secondary | ICD-10-CM

## 2024-01-08 MED ORDER — PROMETHAZINE HCL 12.5 MG PO TABS: 12.5000 mg | ORAL_TABLET | Freq: Three times a day (TID) | ORAL | 0 refills | Status: DC | PRN

## 2024-01-08 NOTE — Telephone Encounter (Signed)
 Patient called stating that she was dx with Flu A last week but the cough is unbearable and is requesting to get a cough syrup with codiene called in please advise

## 2024-02-12 ENCOUNTER — Ambulatory Visit: Payer: Self-pay | Admitting: Cardiology

## 2024-02-14 ENCOUNTER — Other Ambulatory Visit: Payer: Self-pay

## 2024-02-14 DIAGNOSIS — E1165 Type 2 diabetes mellitus with hyperglycemia: Secondary | ICD-10-CM

## 2024-02-14 MED ORDER — INSULIN LISPRO (1 UNIT DIAL) 100 UNIT/ML (KWIKPEN)
7.0000 [IU] | PEN_INJECTOR | Freq: Three times a day (TID) | SUBCUTANEOUS | 11 refills | Status: AC
Start: 2024-02-14 — End: ?

## 2024-03-13 ENCOUNTER — Other Ambulatory Visit

## 2024-03-13 DIAGNOSIS — E1165 Type 2 diabetes mellitus with hyperglycemia: Secondary | ICD-10-CM

## 2024-03-13 DIAGNOSIS — E782 Mixed hyperlipidemia: Secondary | ICD-10-CM

## 2024-03-13 DIAGNOSIS — I10 Essential (primary) hypertension: Secondary | ICD-10-CM

## 2024-03-14 ENCOUNTER — Ambulatory Visit: Payer: Self-pay | Admitting: Cardiology

## 2024-03-14 ENCOUNTER — Encounter: Payer: Self-pay | Admitting: Cardiology

## 2024-03-14 VITALS — BP 140/80 | HR 112 | Ht 68.0 in | Wt 177.4 lb

## 2024-03-14 DIAGNOSIS — E782 Mixed hyperlipidemia: Secondary | ICD-10-CM

## 2024-03-14 DIAGNOSIS — E1165 Type 2 diabetes mellitus with hyperglycemia: Secondary | ICD-10-CM | POA: Diagnosis not present

## 2024-03-14 DIAGNOSIS — E118 Type 2 diabetes mellitus with unspecified complications: Secondary | ICD-10-CM

## 2024-03-14 DIAGNOSIS — I10 Essential (primary) hypertension: Secondary | ICD-10-CM

## 2024-03-14 DIAGNOSIS — Z1231 Encounter for screening mammogram for malignant neoplasm of breast: Secondary | ICD-10-CM

## 2024-03-14 DIAGNOSIS — R000 Tachycardia, unspecified: Secondary | ICD-10-CM

## 2024-03-14 DIAGNOSIS — Z1211 Encounter for screening for malignant neoplasm of colon: Secondary | ICD-10-CM

## 2024-03-14 LAB — CMP14+EGFR
ALT: 13 IU/L (ref 0–32)
AST: 14 IU/L (ref 0–40)
Albumin: 3.7 g/dL — ABNORMAL LOW (ref 3.9–4.9)
Alkaline Phosphatase: 73 IU/L (ref 44–121)
BUN/Creatinine Ratio: 21 (ref 12–28)
BUN: 18 mg/dL (ref 8–27)
Bilirubin Total: 0.3 mg/dL (ref 0.0–1.2)
CO2: 24 mmol/L (ref 20–29)
Calcium: 9.7 mg/dL (ref 8.7–10.3)
Chloride: 107 mmol/L — ABNORMAL HIGH (ref 96–106)
Creatinine, Ser: 0.86 mg/dL (ref 0.57–1.00)
Globulin, Total: 2.3 g/dL (ref 1.5–4.5)
Glucose: 94 mg/dL (ref 70–99)
Potassium: 4.3 mmol/L (ref 3.5–5.2)
Sodium: 144 mmol/L (ref 134–144)
Total Protein: 6 g/dL (ref 6.0–8.5)
eGFR: 77 mL/min/{1.73_m2} (ref >59)

## 2024-03-14 LAB — CBC WITH DIFFERENTIAL/PLATELET
Basophils Absolute: 0 10*3/uL (ref 0.0–0.2)
Basos: 0 %
EOS (ABSOLUTE): 0.1 10*3/uL (ref 0.0–0.4)
Eos: 1 %
Hematocrit: 37.5 % (ref 34.0–46.6)
Hemoglobin: 12.1 g/dL (ref 11.1–15.9)
Immature Grans (Abs): 0 10*3/uL (ref 0.0–0.1)
Immature Granulocytes: 0 %
Lymphocytes Absolute: 1.3 10*3/uL (ref 0.7–3.1)
Lymphs: 26 %
MCH: 30.4 pg (ref 26.6–33.0)
MCHC: 32.3 g/dL (ref 31.5–35.7)
MCV: 94 fL (ref 79–97)
Monocytes Absolute: 0.3 10*3/uL (ref 0.1–0.9)
Monocytes: 5 %
Neutrophils Absolute: 3.5 10*3/uL (ref 1.4–7.0)
Neutrophils: 68 %
Platelets: 316 10*3/uL (ref 150–450)
RBC: 3.98 x10E6/uL (ref 3.77–5.28)
RDW: 13 % (ref 11.7–15.4)
WBC: 5.1 10*3/uL (ref 3.4–10.8)

## 2024-03-14 LAB — LIPID PANEL
Chol/HDL Ratio: 2.3 ratio (ref 0.0–4.4)
Cholesterol, Total: 73 mg/dL — ABNORMAL LOW (ref 100–199)
HDL: 32 mg/dL — ABNORMAL LOW (ref >39)
LDL Chol Calc (NIH): 27 mg/dL (ref 0–99)
Triglycerides: 56 mg/dL (ref 0–149)
VLDL Cholesterol Cal: 14 mg/dL (ref 5–40)

## 2024-03-14 LAB — POCT UA - MICROALBUMIN
Albumin/Creatinine Ratio, Urine, POC: 150
Creatinine, POC: 100 mg/dL
Microalbumin Ur, POC: >300 mg/L

## 2024-03-14 LAB — HEMOGLOBIN A1C
Est. average glucose Bld gHb Est-mCnc: 114 mg/dL
Hgb A1c MFr Bld: 5.6 % (ref 4.8–5.6)

## 2024-03-14 LAB — GLUCOSE, POCT (MANUAL RESULT ENTRY): POC Glucose: 302 mg/dL — AB (ref 70–99)

## 2024-03-14 MED ORDER — METOPROLOL SUCCINATE ER 25 MG PO TB24: 25.0000 mg | ORAL_TABLET | Freq: Every day | ORAL | 11 refills | Status: DC

## 2024-03-14 NOTE — Progress Notes (Signed)
 Established Patient Office Visit  Subjective:  Patient ID: Gabriella Young, female    DOB: 05/15/1961  Age: 63 y.o. MRN: [id number]  Chief Complaint  Patient presents with   Follow-up    Follow up    Patient in office for 4 month follow up, discuss recent lab results. Patient doing well, no complaints today.  Patient seeing endocrinology at [facility] for diabetes. Hgb A1c much improved, continue same medications. LDL much improved, continue statin.  Heart rate elevated. Patient stopped diltiazem  due to headaches. Patient has taken metoprolol succinate in the past, caused fatigue. Was taking it in the morning. Will retry metoprolol succinate 25 mg at bedtime.  Over due for mammogram, order placed. Over due for colonoscopy, referral placed.     No other concerns at this time.   Past Medical History:  Diagnosis Date   Anemia    HX OF/ RESOLVED AFTER HYSTERECTOMY   Arthritis    HAND,FEET,KNEES   Bunion right   08.21.2014, surgery scheduled 9.26.14   Bursitis 06/20/2013   plantar aspect of hallux right   Congenital absence of one kidney    RIGHT KIDNEY NEVER DEVELOPED, REMOVED AT 63 YEARS OLD   Dental crowns present    VENEERS ON FRONT UPPER TEETH   Diabetes mellitus without complication (HCC)    High cholesterol    Hypertension    CONTROLLED ON MEDS   Legally blind    WEARS CONTACTS   Plantar fasciitis of right foot 05/23/2013   WITH LATERAL COMPENSATORY SYNDROME   Seasonal allergies    Sesamoiditis 06/20/2013   HALLUX RIGHT AND HALLUX INTERPHALANGEUM   Tachycardia    ON MEDS/ DR Custer Cid    Vertigo    HX OF THREE YEARS AGO   Wears contact lenses     Past Surgical History:  Procedure Laterality Date   ABDOMINAL HYSTERECTOMY     BREAST BIOPSY Left 2013   ESOPHAGEAL DILATION N/A 07/07/2016   Procedure: ESOPHAGOGASTRODUODENOSCOPY (EGD);  Surgeon: Marnee Sink, MD;  Location: [facility];  Service: Endoscopy;  Laterality: N/A;   KIDNEY SURGERY Right    REMOVED    PARTIAL HYSTERECTOMY     RHINOPLASTY     TOE SURGERY Right    screw in great toe   TONSILECTOMY, ADENOIDECTOMY, BILATERAL MYRINGOTOMY AND TUBES     TUBAL LIGATION      Social History   Socioeconomic History   Marital status: Married    Spouse name: Myrtie Atkinson   Number of children: 2   Years of education: 16+   Highest education level: Not on file  Occupational History   Occupation: Unemployed  Tobacco Use   Smoking status: Never   Smokeless tobacco: Never  Vaping Use   Vaping status: Never Used  Substance and Sexual Activity   Alcohol use: No   Drug use: No   Sexual activity: Not Currently  Other Topics Concern   Not on file  Social History Narrative   Lives with husband, Myrtie Atkinson   Caffeine use: 1 cup coffee 3x/week   Pepsi with meals sometimes    Social Drivers of Corporate investment banker Strain: Not on file  Food Insecurity: Not on file  Transportation Needs: Not on file  Physical Activity: Not on file  Stress: Not on file  Social Connections: Not on file  Intimate Partner Violence: Not on file    Family History  Problem Relation Age of Onset   Diabetes Mother    Dementia Mother  Diabetes Father    Heart attack Father    Diabetes Maternal Aunt    Breast cancer Paternal Aunt 59   Diabetes Maternal Grandmother    Dementia Maternal Grandmother    Stroke Maternal Grandmother     Allergies  Allergen Reactions   Codeine Other (See Comments)    MAKES DROWSY/ NOT REALLY ALLERGY, DOESN'T LIKE THE FEELING   Sulfa Antibiotics Hives   Sulfasalazine Hives    Outpatient Medications Prior to Visit  Medication Sig Note   clonazePAM (KLONOPIN) 0.5 MG tablet Take by mouth.    Continuous Glucose Receiver (DEXCOM G7 RECEIVER) DEVI 2 mg by Does not apply route once a week.    Continuous Glucose Sensor (DEXCOM G7 SENSOR) MISC 2 mg by Does not apply route once a week.    fluticasone (FLONASE) 50 MCG/ACT nasal spray Place 2 sprays into both nostrils 2 (two) times daily.     gabapentin  (NEURONTIN ) 100 MG capsule Take 1 capsule (100 mg total) by mouth daily. 03/14/2024: As needed   insulin  degludec (TRESIBA  FLEXTOUCH) 100 UNIT/ML FlexTouch Pen Inject 11 Units into the skin at bedtime.    insulin  lispro (HUMALOG  KWIKPEN) 100 UNIT/ML KwikPen Inject 7 Units into the skin 3 (three) times daily.    Insulin  Pen Needle 32G X 4 MM MISC 1 each by Does not apply route in the morning, at noon, in the evening, and at bedtime.    lisinopril  (ZESTRIL ) 10 MG tablet Take 1 tablet (10 mg total) by mouth daily.    Multiple Vitamin (MULTIVITAMIN) tablet Take 1 tablet by mouth daily.    ondansetron  (ZOFRAN -ODT) 4 MG disintegrating tablet Take 1 tablet (4 mg total) by mouth every 8 (eight) hours as needed for nausea or vomiting.    pravastatin (PRAVACHOL) 40 MG tablet TAKE 1 TABLET BY MOUTH EVERY EVENING    Semaglutide (OZEMPIC, 2 MG/DOSE, [place]) Inject 2 mg into the skin once a week.    [DISCONTINUED] albuterol  (VENTOLIN  HFA) 108 (90 Base) MCG/ACT inhaler Inhale 2 puffs into the lungs every 6 (six) hours as needed for wheezing or shortness of breath.    [DISCONTINUED] Continuous Glucose Sensor (FREESTYLE LIBRE 3 SENSOR) MISC 2 each by Does not apply route every 14 (fourteen) days. Apply to arm every 14 days for sugar readings (Patient not taking: Reported on 03/14/2024)    [DISCONTINUED] methylPREDNISolone  (MEDROL  DOSEPAK) 4 MG TBPK tablet Take as directed (Patient not taking: Reported on 03/14/2024)    [DISCONTINUED] mupirocin  ointment (BACTROBAN ) 2 % Apply 1 Application topically 2 (two) times daily. (Patient not taking: Reported on 03/14/2024)    [DISCONTINUED] promethazine  (PHENERGAN ) 12.5 MG tablet Take 1 tablet (12.5 mg total) by mouth every 8 (eight) hours as needed (cough). (Patient not taking: Reported on 03/14/2024)    [DISCONTINUED] venlafaxine  XR (EFFEXOR -XR) 37.5 MG 24 hr capsule Take 1 capsule (37.5 mg total) by mouth daily. (Patient not taking: Reported on 03/14/2024)    No  facility-administered medications prior to visit.    Review of Systems  Constitutional: Negative.   HENT: Negative.    Eyes: Negative.   Respiratory: Negative.  Negative for shortness of breath.   Cardiovascular: Negative.  Negative for chest pain.  Gastrointestinal: Negative.  Negative for abdominal pain, constipation and diarrhea.  Genitourinary: Negative.   Musculoskeletal:  Negative for joint pain and myalgias.  Skin: Negative.   Neurological: Negative.  Negative for dizziness and headaches.  Endo/Heme/Allergies: Negative.   All other systems reviewed and are negative.  Objective:   BP (!) 140/80   Pulse (!) 112   Ht 5\' 8"  (1.727 m)   Wt 177 lb 6.4 oz (80.5 kg)   SpO2 95%   BMI 26.97 kg/m   Vitals:   03/14/24 0956  BP: (!) 140/80  Pulse: (!) 112  Height: 5\' 8"  (1.727 m)  Weight: 177 lb 6.4 oz (80.5 kg)  SpO2: 95%  BMI (Calculated): 26.98    Physical Exam Vitals and nursing note reviewed.  Constitutional:      Appearance: Normal appearance. She is normal weight.  HENT:     Head: Normocephalic and atraumatic.     Nose: Nose normal.     Mouth/Throat:     Mouth: Mucous membranes are moist.  Eyes:     Extraocular Movements: Extraocular movements intact.     Conjunctiva/sclera: Conjunctivae normal.     Pupils: Pupils are equal, round, and reactive to light.  Cardiovascular:     Rate and Rhythm: Normal rate and regular rhythm.     Pulses: Normal pulses.     Heart sounds: Normal heart sounds.  Pulmonary:     Effort: Pulmonary effort is normal.     Breath sounds: Normal breath sounds.  Abdominal:     General: Abdomen is flat. Bowel sounds are normal.     Palpations: Abdomen is soft.  Musculoskeletal:        General: Normal range of motion.     Cervical back: Normal range of motion.  Skin:    General: Skin is warm and dry.  Neurological:     General: No focal deficit present.     Mental Status: She is alert and oriented to person, place, and time.   Psychiatric:        Mood and Affect: Mood normal.        Behavior: Behavior normal.        Thought Content: Thought content normal.        Judgment: Judgment normal.      Results for orders placed or performed in visit on 03/14/24  POCT Glucose (CBG)  Result Value Ref Range   POC Glucose 302 (A) 70 - 99 mg/dl  POCT Urine Albumin/Creatinine with ratio [[id number]]  Result Value Ref Range   Microalbumin Ur, POC >300 mg/L   Creatinine, POC 100 mg/dL   Albumin/Creatinine Ratio, Urine, POC 150     Recent Results (from the past 2160 hours)  Resp panel by RT-PCR (RSV, Flu A&B, Covid) Anterior Nasal Swab     Status: Abnormal   Collection Time: 01/02/24 12:15 PM   Specimen: Anterior Nasal Swab  Result Value Ref Range   SARS Coronavirus 2 by RT PCR NEGATIVE NEGATIVE    Comment: (NOTE) SARS-CoV-2 target nucleic acids are NOT DETECTED.  The SARS-CoV-2 RNA is generally detectable in upper respiratory specimens during the acute phase of infection. The lowest concentration of SARS-CoV-2 viral copies this assay can detect is 138 copies/mL. A negative result does not preclude SARS-Cov-2 infection and should not be used as the sole basis for treatment or other patient management decisions. A negative result may occur with  improper specimen collection/handling, submission of specimen other than nasopharyngeal swab, presence of viral mutation(s) within the areas targeted by this assay, and inadequate number of viral copies(<138 copies/mL). A negative result must be combined with clinical observations, patient history, and epidemiological information. The expected result is Negative.  Fact Sheet for Patients:  BloggerCourse.com  Fact Sheet for Healthcare Providers:  SeriousBroker.it  This  test is no t yet approved or cleared by the [place]  FDA and  has been authorized for detection and/or diagnosis of SARS-CoV-2 by FDA under an  Emergency Use Authorization (EUA). This EUA will remain  in effect (meaning this test can be used) for the duration of the COVID-19 declaration under Section 564(b)(1) of the Act, 21 U.S.C.section 360bbb-3(b)(1), unless the authorization is terminated  or revoked sooner.       Influenza A by PCR POSITIVE (A) NEGATIVE   Influenza B by PCR NEGATIVE NEGATIVE    Comment: (NOTE) The Xpert Xpress SARS-CoV-2/FLU/RSV plus assay is intended as an aid in the diagnosis of influenza from Nasopharyngeal swab specimens and should not be used as a sole basis for treatment. Nasal washings and aspirates are unacceptable for Xpert Xpress SARS-CoV-2/FLU/RSV testing.  Fact Sheet for Patients: BloggerCourse.com  Fact Sheet for Healthcare Providers: SeriousBroker.it  This test is not yet approved or cleared by the [place]  FDA and has been authorized for detection and/or diagnosis of SARS-CoV-2 by FDA under an Emergency Use Authorization (EUA). This EUA will remain in effect (meaning this test can be used) for the duration of the COVID-19 declaration under Section 564(b)(1) of the Act, 21 U.S.C. section 360bbb-3(b)(1), unless the authorization is terminated or revoked.     Resp Syncytial Virus by PCR NEGATIVE NEGATIVE    Comment: (NOTE) Fact Sheet for Patients: BloggerCourse.com  Fact Sheet for Healthcare Providers: SeriousBroker.it  This test is not yet approved or cleared by the [place]  FDA and has been authorized for detection and/or diagnosis of SARS-CoV-2 by FDA under an Emergency Use Authorization (EUA). This EUA will remain in effect (meaning this test can be used) for the duration of the COVID-19 declaration under Section 564(b)(1) of the Act, 21 U.S.C. section 360bbb-3(b)(1), unless the authorization is terminated or revoked.  Performed at [facility], [address] [place], [place] [address]   [id number]+EGFR     Status: Abnormal   Collection Time: 03/13/24 10:15 AM  Result Value Ref Range   Glucose 94 70 - 99 mg/dL   BUN 18 8 - 27 mg/dL   Creatinine, Ser 6.57 0.57 - 1.00 mg/dL   eGFR 77 >84 ON/GEX/5.28   BUN/Creatinine Ratio 21 12 - 28   Sodium 144 134 - 144 mmol/L   Potassium 4.3 3.5 - 5.2 mmol/L   Chloride 107 (H) 96 - 106 mmol/L   CO2 24 20 - 29 mmol/L   Calcium 9.7 8.7 - 10.3 mg/dL   Total Protein 6.0 6.0 - 8.5 g/dL   Albumin 3.7 (L) 3.9 - 4.9 g/dL   Globulin, Total 2.3 1.5 - 4.5 g/dL   Bilirubin Total 0.3 0.0 - 1.2 mg/dL   Alkaline Phosphatase 73 44 - 121 IU/L   AST 14 0 - 40 IU/L   ALT 13 0 - 32 IU/L  Lipid panel     Status: Abnormal   Collection Time: 03/13/24 10:15 AM  Result Value Ref Range   Cholesterol, Total 73 (L) 100 - 199 mg/dL   Triglycerides 56 0 - 149 mg/dL   HDL 32 (L) >41 mg/dL   VLDL Cholesterol Cal 14 5 - 40 mg/dL   LDL Chol Calc (NIH) 27 0 - 99 mg/dL   Chol/HDL Ratio 2.3 0.0 - 4.4 ratio    Comment:  T. Chol/HDL Ratio                                             Men  Women                               1/2 Avg.Risk  3.4    3.3                                   Avg.Risk  5.0    4.4                                2X Avg.Risk  9.6    7.1                                3X Avg.Risk 23.4   11.0   CBC with Differential/Platelet     Status: None   Collection Time: 03/13/24 10:15 AM  Result Value Ref Range   WBC 5.1 3.4 - 10.8 x10E3/uL   RBC 3.98 3.77 - 5.28 x10E6/uL   Hemoglobin 12.1 11.1 - 15.9 g/dL   Hematocrit 69.6 29.5 - 46.6 %   MCV 94 79 - 97 fL   MCH 30.4 26.6 - 33.0 pg   MCHC 32.3 31.5 - 35.7 g/dL   RDW 28.4 13.2 - 44.0 %   Platelets 316 150 - 450 x10E3/uL   Neutrophils 68 Not Estab. %   Lymphs 26 Not Estab. %   Monocytes 5 Not Estab. %   Eos 1 Not Estab. %   Basos 0 Not Estab. %   Neutrophils Absolute 3.5 1.4 - 7.0 x10E3/uL   Lymphocytes Absolute 1.3 0.7 - 3.1  x10E3/uL   Monocytes Absolute 0.3 0.1 - 0.9 x10E3/uL   EOS (ABSOLUTE) 0.1 0.0 - 0.4 x10E3/uL   Basophils Absolute 0.0 0.0 - 0.2 x10E3/uL   Immature Granulocytes 0 Not Estab. %   Immature Grans (Abs) 0.0 0.0 - 0.1 x10E3/uL  Hemoglobin A1c     Status: None   Collection Time: 03/13/24 10:15 AM  Result Value Ref Range   Hgb A1c MFr Bld 5.6 4.8 - 5.6 %    Comment:          Prediabetes: 5.7 - 6.4          Diabetes: >6.4          Glycemic control for adults with diabetes: <7.0    Est. average glucose Bld gHb Est-mCnc 114 mg/dL  POCT Glucose (CBG)     Status: Abnormal   Collection Time: 03/14/24 10:07 AM  Result Value Ref Range   POC Glucose 302 (A) 70 - 99 mg/dl  POCT Urine Albumin/Creatinine with ratio [[id number]]     Status: Abnormal   Collection Time: 03/14/24 10:42 AM  Result Value Ref Range   Microalbumin Ur, POC >300 mg/L   Creatinine, POC 100 mg/dL   Albumin/Creatinine Ratio, Urine, POC 150       Assessment & Plan:  Continue same medications.  Metoprolol for tachycardia. Mammogram order placed. Colonoscopy referral placed.   Problem List Items Addressed This Visit  Cardiovascular and Mediastinum   Benign essential hypertension   Relevant Medications   metoprolol succinate (TOPROL XL) 25 MG 24 hr tablet     Endocrine   Poorly controlled type 2 diabetes mellitus with complication (HCC) - Primary   Relevant Medications   insulin  degludec (TRESIBA  FLEXTOUCH) 100 UNIT/ML FlexTouch Pen   Semaglutide (OZEMPIC, 2 MG/DOSE,  [place])   Other Relevant Orders   POCT Glucose (CBG) (Completed)   POCT Urine Albumin/Creatinine with ratio [[id number]] (Completed)     Other   Hyperlipidemia   Relevant Medications   metoprolol succinate (TOPROL XL) 25 MG 24 hr tablet   Sinus tachycardia   Other Visit Diagnoses       Breast cancer screening by mammogram       Relevant Orders   MM 3D SCREENING MAMMOGRAM BILATERAL BREAST     Colon cancer screening       Relevant Orders    Ambulatory referral to Gastroenterology       Return in about 4 months (around 07/15/2024) for with fasting labs prior.   Total time spent: 25 minutes  [employer], NP  03/14/2024   This document may have been prepared by Dragon Voice Recognition software and as such may include unintentional dictation errors.

## 2024-03-18 ENCOUNTER — Telehealth: Payer: Self-pay

## 2024-03-18 NOTE — Telephone Encounter (Signed)
 The patient called back to schedule her colonoscopy.

## 2024-03-19 ENCOUNTER — Telehealth: Payer: Self-pay

## 2024-03-19 ENCOUNTER — Other Ambulatory Visit: Payer: Self-pay

## 2024-03-19 DIAGNOSIS — Z1211 Encounter for screening for malignant neoplasm of colon: Secondary | ICD-10-CM

## 2024-03-19 MED ORDER — SUTAB 1479-225-188 MG PO TABS: 12.0000 | ORAL_TABLET | Freq: Two times a day (BID) | ORAL | 0 refills | Status: AC

## 2024-03-19 NOTE — Telephone Encounter (Signed)
 Gastroenterology Pre-Procedure Review  Request Date: 04/16/24 Requesting Physician: Dr. Ole Berkeley  PATIENT REVIEW QUESTIONS: The patient responded to the following health history questions as indicated:    1. Are you having any GI issues? no 2. Do you have a personal history of Polyps? no 3. Do you have a family history of Colon Cancer or Polyps? no 4. Diabetes Mellitus? yes (patient takes lispro and tresiba  she has been advised to take 1/2 of usual amount on the day before her procedure.  She also takes semaglutide and has been advised and also noted on instructions to stop 7 days prior to colonoscopy.  Advised if she has any concerns with blood sugars as she prepares to contact her pcp) 5. Joint replacements in the past 12 months?no 6. Major health problems in the past 3 months?no 7. Any artificial heart valves, MVP, or defibrillator?no    MEDICATIONS & ALLERGIES:    Patient reports the following regarding taking any anticoagulation/antiplatelet therapy:   Plavix, Coumadin, Eliquis, Xarelto, Lovenox, Pradaxa, Brilinta, or Effient? no Aspirin ? no  Patient confirms/reports the following medications:  Current Outpatient Medications  Medication Sig Dispense Refill   Sodium Sulfate-Mag Sulfate-KCl (SUTAB) [phone] MG TABS Take 12 tablets by mouth 2 (two) times daily for 1 day. 24 tablet 0   clonazePAM (KLONOPIN) 0.5 MG tablet Take by mouth.     Continuous Glucose Receiver (DEXCOM G7 RECEIVER) DEVI 2 mg by Does not apply route once a week.     Continuous Glucose Sensor (DEXCOM G7 SENSOR) MISC 2 mg by Does not apply route once a week.     fluticasone (FLONASE) 50 MCG/ACT nasal spray Place 2 sprays into both nostrils 2 (two) times daily.     gabapentin  (NEURONTIN ) 100 MG capsule Take 1 capsule (100 mg total) by mouth daily. 30 capsule 2   insulin  degludec (TRESIBA  FLEXTOUCH) 100 UNIT/ML FlexTouch Pen Inject 11 Units into the skin at bedtime.     insulin  lispro (HUMALOG  KWIKPEN) 100 UNIT/ML  KwikPen Inject 7 Units into the skin 3 (three) times daily. 15 mL 11   Insulin  Pen Needle 32G X 4 MM MISC 1 each by Does not apply route in the morning, at noon, in the evening, and at bedtime. 120 each 3   lisinopril  (ZESTRIL ) 10 MG tablet Take 1 tablet (10 mg total) by mouth daily. 30 tablet 11   metoprolol  succinate (TOPROL  XL) 25 MG 24 hr tablet Take 1 tablet (25 mg total) by mouth daily. 30 tablet 11   Multiple Vitamin (MULTIVITAMIN) tablet Take 1 tablet by mouth daily.     ondansetron  (ZOFRAN -ODT) 4 MG disintegrating tablet Take 1 tablet (4 mg total) by mouth every 8 (eight) hours as needed for nausea or vomiting. 15 tablet 0   pravastatin (PRAVACHOL) 40 MG tablet TAKE 1 TABLET BY MOUTH EVERY EVENING 90 tablet 0   Semaglutide (OZEMPIC, 2 MG/DOSE, [place]) Inject 2 mg into the skin once a week.     No current facility-administered medications for this visit.    Patient confirms/reports the following allergies:  Allergies  Allergen Reactions   Codeine Other (See Comments)    MAKES DROWSY/ NOT REALLY ALLERGY, DOESN'T LIKE THE FEELING   Sulfa Antibiotics Hives   Sulfasalazine Hives    No orders of the defined types were placed in this encounter.   AUTHORIZATION INFORMATION Primary Insurance: 1D#: Group #:  Secondary Insurance: 1D#: Group #:  SCHEDULE INFORMATION: Date: 04/16/24 Time: Location: [facility]

## 2024-03-28 ENCOUNTER — Inpatient Hospital Stay
Admission: RE | Admit: 2024-03-28 | Discharge: 2024-03-28 | Disposition: A | Discharge status: Home / Self Care | Payer: Self-pay | Source: Ambulatory Visit | Attending: Cardiology

## 2024-03-28 ENCOUNTER — Inpatient Hospital Stay
Admission: RE | Admit: 2024-03-28 | Discharge: 2024-03-28 | Disposition: A | Discharge status: Home / Self Care | Payer: Self-pay | Source: Ambulatory Visit | Attending: Cardiology | Admitting: Cardiology

## 2024-03-28 ENCOUNTER — Ambulatory Visit: Admitting: Cardiovascular Disease

## 2024-03-28 ENCOUNTER — Other Ambulatory Visit: Payer: Self-pay | Admitting: *Deleted

## 2024-03-28 DIAGNOSIS — Z1231 Encounter for screening mammogram for malignant neoplasm of breast: Secondary | ICD-10-CM

## 2024-04-09 ENCOUNTER — Ambulatory Visit: Payer: Self-pay | Admitting: Internal Medicine

## 2024-04-09 ENCOUNTER — Encounter: Payer: Self-pay | Admitting: Internal Medicine

## 2024-04-09 ENCOUNTER — Ambulatory Visit
Admission: RE | Admit: 2024-04-09 | Discharge: 2024-04-09 | Disposition: A | Discharge status: Home / Self Care | Source: Ambulatory Visit | Attending: Cardiology | Admitting: Cardiology

## 2024-04-09 ENCOUNTER — Encounter: Payer: Self-pay | Admitting: Gastroenterology

## 2024-04-09 ENCOUNTER — Encounter

## 2024-04-09 DIAGNOSIS — Z1231 Encounter for screening mammogram for malignant neoplasm of breast: Secondary | ICD-10-CM | POA: Insufficient documentation

## 2024-04-13 ENCOUNTER — Other Ambulatory Visit: Payer: Self-pay | Admitting: Internal Medicine

## 2024-04-15 ENCOUNTER — Other Ambulatory Visit: Payer: Self-pay

## 2024-04-15 MED ORDER — VENLAFAXINE HCL ER 37.5 MG PO CP24: 37.5000 mg | ORAL_CAPSULE | Freq: Every day | ORAL | 2 refills | Status: AC

## 2024-04-16 ENCOUNTER — Ambulatory Visit

## 2024-04-16 ENCOUNTER — Other Ambulatory Visit: Payer: Self-pay

## 2024-04-16 ENCOUNTER — Ambulatory Visit
Admission: RE | Admit: 2024-04-16 | Discharge: 2024-04-16 | Disposition: A | Discharge status: Home / Self Care | Payer: Self-pay | Attending: Gastroenterology | Admitting: Gastroenterology

## 2024-04-16 ENCOUNTER — Encounter
Admission: RE | Disposition: A | Discharge status: Home / Self Care | Payer: Self-pay | Source: Home / Self Care | Attending: Gastroenterology

## 2024-04-16 ENCOUNTER — Encounter: Payer: Self-pay | Admitting: Gastroenterology

## 2024-04-16 DIAGNOSIS — I10 Essential (primary) hypertension: Secondary | ICD-10-CM | POA: Insufficient documentation

## 2024-04-16 DIAGNOSIS — Z7985 Long-term (current) use of injectable non-insulin antidiabetic drugs: Secondary | ICD-10-CM | POA: Insufficient documentation

## 2024-04-16 DIAGNOSIS — Z794 Long term (current) use of insulin: Secondary | ICD-10-CM | POA: Diagnosis not present

## 2024-04-16 DIAGNOSIS — K635 Polyp of colon: Secondary | ICD-10-CM | POA: Diagnosis not present

## 2024-04-16 DIAGNOSIS — E119 Type 2 diabetes mellitus without complications: Secondary | ICD-10-CM | POA: Insufficient documentation

## 2024-04-16 DIAGNOSIS — Z1211 Encounter for screening for malignant neoplasm of colon: Secondary | ICD-10-CM | POA: Insufficient documentation

## 2024-04-16 DIAGNOSIS — G4730 Sleep apnea, unspecified: Secondary | ICD-10-CM | POA: Insufficient documentation

## 2024-04-16 DIAGNOSIS — K5730 Diverticulosis of large intestine without perforation or abscess without bleeding: Secondary | ICD-10-CM | POA: Insufficient documentation

## 2024-04-16 DIAGNOSIS — D120 Benign neoplasm of cecum: Secondary | ICD-10-CM | POA: Insufficient documentation

## 2024-04-16 DIAGNOSIS — D122 Benign neoplasm of ascending colon: Secondary | ICD-10-CM | POA: Diagnosis not present

## 2024-04-16 DIAGNOSIS — D3610 Benign neoplasm of peripheral nerves and autonomic nervous system, unspecified: Secondary | ICD-10-CM | POA: Diagnosis not present

## 2024-04-16 HISTORY — PX: SUBMUCOSAL INJECTION: SHX5543

## 2024-04-16 HISTORY — PX: COLONOSCOPY: SHX5424

## 2024-04-16 HISTORY — PX: HEMOSTASIS CLIP PLACEMENT: SHX6857

## 2024-04-16 HISTORY — DX: Sleep apnea, unspecified: G47.30

## 2024-04-16 HISTORY — PX: POLYPECTOMY: SHX149

## 2024-04-16 LAB — GLUCOSE, CAPILLARY: Glucose-Capillary: 159 mg/dL — ABNORMAL HIGH (ref 70–99)

## 2024-04-16 SURGERY — COLONOSCOPY
Anesthesia: General

## 2024-04-16 MED ORDER — PROPOFOL 10 MG/ML IV BOLUS
INTRAVENOUS | Status: DC | PRN
  Administered 2024-04-16: 50 mg via INTRAVENOUS

## 2024-04-16 MED ORDER — ONDANSETRON HCL 4 MG/2ML IJ SOLN
INTRAMUSCULAR | Status: AC
  Filled 2024-04-16: qty 2

## 2024-04-16 MED ORDER — ONDANSETRON HCL 4 MG/2ML IJ SOLN
INTRAMUSCULAR | Status: DC | PRN
Start: 2024-04-16 — End: 2024-04-16
  Administered 2024-04-16: 4 mg via INTRAVENOUS

## 2024-04-16 MED ORDER — PROPOFOL 500 MG/50ML IV EMUL
INTRAVENOUS | Status: DC | PRN
  Administered 2024-04-16: 150 ug/kg/min via INTRAVENOUS

## 2024-04-16 MED ORDER — PROPOFOL 10 MG/ML IV BOLUS
INTRAVENOUS | Status: AC
  Filled 2024-04-16: qty 20

## 2024-04-16 MED ORDER — SODIUM CHLORIDE 0.9 % IV SOLN: INTRAVENOUS | Status: DC

## 2024-04-16 MED ORDER — LIDOCAINE HCL (CARDIAC) PF 100 MG/5ML IV SOSY
PREFILLED_SYRINGE | INTRAVENOUS | Status: DC | PRN
  Administered 2024-04-16: 60 mg via INTRAVENOUS

## 2024-04-16 NOTE — Anesthesia Preprocedure Evaluation (Signed)
 Anesthesia Evaluation  Patient identified by MRN, date of birth, ID band Patient awake    Reviewed: Allergy & Precautions, NPO status , Patient's Chart, lab work & pertinent test results  History of Anesthesia Complications Negative for: history of anesthetic complications  Airway Mallampati: II  TM Distance: >3 FB Neck ROM: Full    Dental no notable dental hx. (+) Teeth Intact   Pulmonary sleep apnea , neg COPD, Patient abstained from smoking.Not current smoker   Pulmonary exam normal breath sounds clear to auscultation       Cardiovascular Exercise Tolerance: Good METShypertension, Pt. on medications (-) CAD and (-) Past MI (-) dysrhythmias  Rhythm:Regular Rate:Normal - Systolic murmurs    Neuro/Psych  PSYCHIATRIC DISORDERS Anxiety     negative neurological ROS     GI/Hepatic ,neg GERD  ,,(+)     (-) substance abuse    Endo/Other  diabetes, Insulin  Dependent  GLP1 agonist off for 12 days, denieas GI symptoms today  Renal/GU negative Renal ROS     Musculoskeletal   Abdominal   Peds  Hematology   Anesthesia Other Findings Past Medical History: No date: Anemia     Comment:  HX OF/ RESOLVED AFTER HYSTERECTOMY No date: Arthritis     Comment:  HAND,FEET,KNEES right: Bunion     Comment:  08.21.2014, surgery scheduled 9.26.14 06/20/2013: Bursitis     Comment:  plantar aspect of hallux right No date: Congenital absence of one kidney     Comment:  RIGHT KIDNEY NEVER DEVELOPED, REMOVED AT 63 YEARS OLD No date: Dental crowns present     Comment:  VENEERS ON FRONT UPPER TEETH No date: Diabetes mellitus without complication (HCC) No date: High cholesterol No date: Hypertension     Comment:  CONTROLLED ON MEDS No date: Legally blind     Comment:  WEARS CONTACTS 05/23/2013: Plantar fasciitis of right foot     Comment:  WITH LATERAL COMPENSATORY SYNDROME No date: Seasonal allergies 06/20/2013: Sesamoiditis      Comment:  HALLUX RIGHT AND HALLUX INTERPHALANGEUM No date: Sleep apnea No date: Tachycardia     Comment:  ON MEDS/ DR Wilkerson Cid  No date: Vertigo     Comment:  HX OF THREE YEARS AGO No date: Wears contact lenses  Reproductive/Obstetrics                             Anesthesia Physical Anesthesia Plan  ASA: 2  Anesthesia Plan: General   Post-op Pain Management: Minimal or no pain anticipated   Induction: Intravenous  PONV Risk Score and Plan: 3 and Propofol  infusion, TIVA and Ondansetron   Airway Management Planned: Nasal Cannula  Additional Equipment: None  Intra-op Plan:   Post-operative Plan:   Informed Consent: I have reviewed the patients History and Physical, chart, labs and discussed the procedure including the risks, benefits and alternatives for the proposed anesthesia with the patient or authorized representative who has indicated his/her understanding and acceptance.     Dental advisory given  Plan Discussed with: CRNA and Surgeon  Anesthesia Plan Comments: (Discussed risks of anesthesia with patient, including possibility of difficulty with spontaneous ventilation under anesthesia necessitating airway intervention, PONV, and rare risks such as cardiac or respiratory or neurological events, and allergic reactions. Discussed the role of CRNA in patient's perioperative care. Patient understands.)       Anesthesia Quick Evaluation

## 2024-04-16 NOTE — Op Note (Signed)
 [facility] Gastroenterology Patient Name: Gabriella Young Procedure Date: 04/16/2024 9:34 AM MRN: [id number] Account #: [id number] Date of Birth: Oct 09, 1961 Admit Type: Outpatient Age: 63 Room: [facility] ENDO ROOM 4 Gender: Female Note Status: Finalized Instrument Name: Charlyn Cooley [id number] Procedure:             Colonoscopy Indications:           Screening for colorectal malignant neoplasm Providers:             Marnee Sink MD, MD Referring MD:          No Local Md, MD (Referring MD) Medicines:             Propofol  per Anesthesia Complications:         No immediate complications. Procedure:             Pre-Anesthesia Assessment:                        - Prior to the procedure, a History and Physical was                         performed, and patient medications and allergies were                         reviewed. The patient's tolerance of previous                         anesthesia was also reviewed. The risks and benefits                         of the procedure and the sedation options and risks                         were discussed with the patient. All questions were                         answered, and informed consent was obtained. Prior                         Anticoagulants: The patient has taken no anticoagulant                         or antiplatelet agents. ASA Grade Assessment: II - A                         patient with mild systemic disease. After reviewing                         the risks and benefits, the patient was deemed in                         satisfactory condition to undergo the procedure.                        After obtaining informed consent, the colonoscope was                         passed under direct vision. Throughout the procedure,  the patient's blood pressure, pulse, and oxygen                         saturations were monitored continuously. The                         Colonoscope was introduced through  the anus and                         advanced to the the cecum, identified by appendiceal                         orifice and ileocecal valve. The colonoscopy was                         performed without difficulty. The patient tolerated                         the procedure well. The quality of the bowel                         preparation was fair. Findings:      The perianal and digital rectal examinations were normal.      A 10 mm polyp was found in the cecum. The polyp was sessile.       Preparations were made for mucosal resection. Chromoscopy with indigo       carmine was done. Demarcation of the lesion was performed during the       procedure to clearly identify the boundaries of the lesion. Saline with       indigo carmine was injected to raise the lesion. Snare mucosal resection       was performed. Resection and retrieval were complete. Resected tissue       margins were examined and clear of polyp tissue. To close a defect after       polypectomy, one hemostatic clip was successfully placed (MR       conditional). Clip manufacturer: [employer]. There was no       bleeding at the end of the procedure.      A 10 mm polyp was found in the ascending colon. The polyp was sessile.       The polyp was removed with a cold snare. Resection and retrieval were       complete. To close a defect after polypectomy, two hemostatic clips were       successfully placed (MR conditional). Clip manufacturer: [employer]. There was no bleeding at the end of the procedure.      An 8 mm polyp was found in the ascending colon. The polyp was sessile.       The polyp was removed with a cold snare. Resection and retrieval were       complete.      Two sessile polyps were found in the sigmoid colon. The polyps were 3 to       5 mm in size. These polyps were removed with a cold snare. Resection and       retrieval were complete.      A few small-mouthed diverticula were found in the  sigmoid colon. Impression:            - Preparation of  the colon was fair.                        - One 10 mm polyp in the cecum, removed with mucosal                         resection. Resected and retrieved. Clip (MR                         conditional) was placed. Clip manufacturer: [employer].                        - One 10 mm polyp in the ascending colon, removed with                         a cold snare. Resected and retrieved. Clips (MR                         conditional) were placed. Clip manufacturer: [employer].                        - One 8 mm polyp in the ascending colon, removed with                         a cold snare. Resected and retrieved.                        - Two 3 to 5 mm polyps in the sigmoid colon, removed                         with a cold snare. Resected and retrieved.                        - Diverticulosis in the sigmoid colon.                        - Mucosal resection was performed. Resection and                         retrieval were complete. Recommendation:        - Discharge patient to home.                        - Resume previous diet.                        - Await pathology results.                        - If the pathology report reveals adenomatous tissue,                         then repeat the colonoscopy for surveillance in 1 year. Procedure Code(s):     --- Professional ---  16109, Colonoscopy, flexible; with endoscopic mucosal                         resection                        45385, 59, Colonoscopy, flexible; with removal of                         tumor(s), polyp(s), or other lesion(s) by snare                         technique Diagnosis Code(s):     --- Professional ---                        Z12.11, Encounter for screening for malignant neoplasm                         of colon                        D12.2, Benign neoplasm of ascending  colon CPT copyright 2022 American Medical Association. All rights reserved. The codes documented in this report are preliminary and upon coder review may  be revised to meet current compliance requirements. Marnee Sink MD, MD 04/16/2024 10:11:54 AM This report has been signed electronically. Number of Addenda: 0 Note Initiated On: 04/16/2024 9:34 AM Scope Withdrawal Time: 0 hours 19 minutes 40 seconds  Total Procedure Duration: 0 hours 30 minutes 14 seconds  Estimated Blood Loss:  Estimated blood loss: none.      [facility]

## 2024-04-16 NOTE — Anesthesia Postprocedure Evaluation (Signed)
 Anesthesia Post Note  Patient: Gabriella Young  Procedure(s) Performed: COLONOSCOPY POLYPECTOMY, INTESTINE INJECTION, SUBMUCOSAL CONTROL OF HEMORRHAGE, GI TRACT, ENDOSCOPIC, BY CLIPPING OR OVERSEWING  Patient location during evaluation: Endoscopy Anesthesia Type: General Level of consciousness: awake and alert Pain management: pain level controlled Vital Signs Assessment: post-procedure vital signs reviewed and stable Respiratory status: spontaneous breathing, nonlabored ventilation, respiratory function stable and patient connected to nasal cannula oxygen Cardiovascular status: blood pressure returned to baseline and stable Postop Assessment: no apparent nausea or vomiting Anesthetic complications: no   No notable events documented.   Last Vitals:  Vitals:   04/16/24 1012 04/16/24 1014  BP: 126/79   Pulse: 81   Resp: 20   Temp:  (!) 35.9 C  SpO2:      Last Pain:  Vitals:   04/16/24 1031  TempSrc:   PainSc: 0-No pain                 Lattie Poli

## 2024-04-16 NOTE — H&P (Signed)
 Gabriella Sink, MD [facility] [address], [address] [place], [place] [address] Phone: [phone] Fax : [phone]  Primary Care Physician:  Alica Antu, NP Primary Gastroenterologist:  Dr. Ole Berkeley  Pre-Procedure History & Physical: HPI:  Gabriella Young is a 63 y.o. female is here for a screening colonoscopy.   Past Medical History:  Diagnosis Date   Anemia    HX OF/ RESOLVED AFTER HYSTERECTOMY   Arthritis    HAND,FEET,KNEES   Bunion right   08.21.2014, surgery scheduled 9.26.14   Bursitis 06/20/2013   plantar aspect of hallux right   Congenital absence of one kidney    RIGHT KIDNEY NEVER DEVELOPED, REMOVED AT 63 YEARS OLD   Dental crowns present    VENEERS ON FRONT UPPER TEETH   Diabetes mellitus without complication (HCC)    High cholesterol    Hypertension    CONTROLLED ON MEDS   Legally blind    WEARS CONTACTS   Plantar fasciitis of right foot 05/23/2013   WITH LATERAL COMPENSATORY SYNDROME   Seasonal allergies    Sesamoiditis 06/20/2013   HALLUX RIGHT AND HALLUX INTERPHALANGEUM   Sleep apnea    Tachycardia    ON MEDS/ DR Tomball Cid    Vertigo    HX OF THREE YEARS AGO   Wears contact lenses     Past Surgical History:  Procedure Laterality Date   ABDOMINAL HYSTERECTOMY     BREAST BIOPSY Left 2013   excisional-benign   ESOPHAGEAL DILATION N/A 07/07/2016   Procedure: ESOPHAGOGASTRODUODENOSCOPY (EGD);  Surgeon: Gabriella Sink, MD;  Location: [facility];  Service: Endoscopy;  Laterality: N/A;   KIDNEY SURGERY Right    REMOVED   PARTIAL HYSTERECTOMY     RHINOPLASTY     TOE SURGERY Right    screw in great toe   TONSILECTOMY, ADENOIDECTOMY, BILATERAL MYRINGOTOMY AND TUBES     TUBAL LIGATION      Prior to Admission medications   Medication Sig Start Date End Date Taking? Authorizing Provider  gabapentin  (NEURONTIN ) 100 MG capsule Take 1 capsule (100 mg total) by mouth daily. 05/22/23 05/21/24 Yes Scoggins, Amber, NP  insulin  degludec (TRESIBA  FLEXTOUCH) 100  UNIT/ML FlexTouch Pen Inject 11 Units into the skin at bedtime.   Yes [provider]  insulin  lispro (HUMALOG  KWIKPEN) 100 UNIT/ML KwikPen Inject 7 Units into the skin 3 (three) times daily. 02/14/24  Yes Scoggins, Hospital doctor, NP  lisinopril  (ZESTRIL ) 10 MG tablet Take 1 tablet (10 mg total) by mouth daily. 07/24/23 07/23/24 Yes Debborah Fairly A, MD  pravastatin (PRAVACHOL) 40 MG tablet TAKE 1 TABLET BY MOUTH EVERY EVENING 11/17/23  Yes Scoggins, Amber, NP  venlafaxine  XR (EFFEXOR -XR) 37.5 MG 24 hr capsule Take 1 capsule (37.5 mg total) by mouth daily. 04/15/24  Yes Scoggins, Amber, NP  clonazePAM (KLONOPIN) 0.5 MG tablet Take by mouth. 11/02/22   [provider]  Continuous Glucose Receiver (DEXCOM G7 RECEIVER) DEVI 2 mg by Does not apply route once a week.    [provider]  Continuous Glucose Sensor (DEXCOM G7 SENSOR) MISC 2 mg by Does not apply route once a week.    [provider]  fluticasone (FLONASE) 50 MCG/ACT nasal spray Place 2 sprays into both nostrils 2 (two) times daily.    [provider]  Insulin  Pen Needle 32G X 4 MM MISC 1 each by Does not apply route in the morning, at noon, in the evening, and at bedtime. 02/06/23   Boswell, Chelsa, NP  metoprolol  succinate (  TOPROL  XL) 25 MG 24 hr tablet Take 1 tablet (25 mg total) by mouth daily. 03/14/24 03/14/25  Scoggins, Amber, NP  Multiple Vitamin (MULTIVITAMIN) tablet Take 1 tablet by mouth daily.    [provider]  ondansetron  (ZOFRAN -ODT) 4 MG disintegrating tablet Take 1 tablet (4 mg total) by mouth every 8 (eight) hours as needed for nausea or vomiting. 01/02/24   Menshew, Raye Cai, PA-C  Semaglutide (OZEMPIC, 2 MG/DOSE, Lea) Inject 2 mg into the skin once a week.    [provider]    Allergies as of 03/19/2024 - Review Complete 03/14/2024  Allergen Reaction Noted   Codeine Other (See Comments) 07/22/2013   Sulfa antibiotics Hives 07/22/2013   Sulfasalazine Hives 07/22/2013     Family History  Problem Relation Age of Onset   Diabetes Mother    Dementia Mother    Diabetes Father    Heart attack Father    Diabetes Maternal Aunt    Breast cancer Paternal Aunt 20   Diabetes Maternal Grandmother    Dementia Maternal Grandmother    Stroke Maternal Grandmother     Social History   Socioeconomic History   Marital status: Married    Spouse name: Myrtie Atkinson   Number of children: 2   Years of education: 16+   Highest education level: Not on file  Occupational History   Occupation: Unemployed  Tobacco Use   Smoking status: Never   Smokeless tobacco: Never  Vaping Use   Vaping status: Never Used  Substance and Sexual Activity   Alcohol use: No   Drug use: No   Sexual activity: Not Currently  Other Topics Concern   Not on file  Social History Narrative   Lives with husband, Myrtie Atkinson   Caffeine use: 1 cup coffee 3x/week   Pepsi with meals sometimes    Social Drivers of Corporate investment banker Strain: Not on file  Food Insecurity: Not on file  Transportation Needs: Not on file  Physical Activity: Not on file  Stress: Not on file  Social Connections: Not on file  Intimate Partner Violence: Not on file    Review of Systems: See HPI, otherwise negative ROS  Physical Exam: BP (!) 142/82   Pulse (!) 106   Temp 98.3 F (36.8 C) (Temporal)   Resp 18   Ht 5' 8 (1.727 m)   Wt 78.1 kg   SpO2 97%   BMI 26.18 kg/m  General:   Alert,  pleasant and cooperative in NAD Head:  Normocephalic and atraumatic. Neck:  Supple; no masses or thyromegaly. Lungs:  Clear throughout to auscultation.    Heart:  Regular rate and rhythm. Abdomen:  Soft, nontender and nondistended. Normal bowel sounds, without guarding, and without rebound.   Neurologic:  Alert and  oriented x4;  grossly normal neurologically.  Impression/Plan: Gabriella Young is now here to undergo a screening colonoscopy.  Risks, benefits, and alternatives regarding colonoscopy have been  reviewed with the patient.  Questions have been answered.  All parties agreeable.

## 2024-04-16 NOTE — Transfer of Care (Signed)
 Immediate Anesthesia Transfer of Care Note  Patient: Gabriella Young  Procedure(s) Performed: COLONOSCOPY POLYPECTOMY, INTESTINE INJECTION, SUBMUCOSAL CONTROL OF HEMORRHAGE, GI TRACT, ENDOSCOPIC, BY CLIPPING OR OVERSEWING  Patient Location: PACU  Anesthesia Type:MAC  Level of Consciousness: awake  Airway & Oxygen Therapy: Patient Spontanous Breathing  Post-op Assessment: Report given to RN and Post -op Vital signs reviewed and stable  Post vital signs: Reviewed and stable  Last Vitals:  Vitals Value Taken Time  BP 126/79 04/16/24 10:12  Temp    Pulse 80 04/16/24 10:13  Resp 23 04/16/24 10:13  SpO2 97 % 04/16/24 10:13  Vitals shown include unfiled device data.  Last Pain:  Vitals:   04/16/24 1012  TempSrc:   PainSc: 0-No pain         Complications: No notable events documented.

## 2024-04-17 ENCOUNTER — Ambulatory Visit: Payer: Self-pay | Admitting: Gastroenterology

## 2024-04-17 LAB — SURGICAL PATHOLOGY

## 2024-05-25 ENCOUNTER — Other Ambulatory Visit: Payer: Self-pay | Admitting: Cardiology

## 2024-07-29 ENCOUNTER — Ambulatory Visit: Admitting: Cardiology

## 2024-07-29 ENCOUNTER — Other Ambulatory Visit

## 2024-07-29 DIAGNOSIS — E1165 Type 2 diabetes mellitus with hyperglycemia: Secondary | ICD-10-CM

## 2024-07-29 DIAGNOSIS — I10 Essential (primary) hypertension: Secondary | ICD-10-CM

## 2024-07-29 DIAGNOSIS — E782 Mixed hyperlipidemia: Secondary | ICD-10-CM

## 2024-07-30 ENCOUNTER — Ambulatory Visit: Payer: Self-pay | Admitting: Cardiology

## 2024-07-30 ENCOUNTER — Encounter: Payer: Self-pay | Admitting: Cardiology

## 2024-07-30 ENCOUNTER — Ambulatory Visit: Admitting: Cardiology

## 2024-07-30 VITALS — BP 135/86 | HR 109 | Ht 68.0 in | Wt 171.0 lb

## 2024-07-30 DIAGNOSIS — E782 Mixed hyperlipidemia: Secondary | ICD-10-CM

## 2024-07-30 DIAGNOSIS — I10 Essential (primary) hypertension: Secondary | ICD-10-CM | POA: Diagnosis not present

## 2024-07-30 DIAGNOSIS — E1165 Type 2 diabetes mellitus with hyperglycemia: Secondary | ICD-10-CM

## 2024-07-30 LAB — CBC WITH DIFFERENTIAL/PLATELET
Basophils Absolute: 0 x10E3/uL (ref 0.0–0.2)
Basos: 1 %
EOS (ABSOLUTE): 0.1 x10E3/uL (ref 0.0–0.4)
Eos: 2 %
Hematocrit: 43.3 % (ref 34.0–46.6)
Hemoglobin: 13.4 g/dL (ref 11.1–15.9)
Immature Grans (Abs): 0 x10E3/uL (ref 0.0–0.1)
Immature Granulocytes: 0 %
Lymphocytes Absolute: 1.7 x10E3/uL (ref 0.7–3.1)
Lymphs: 36 %
MCH: 27.1 pg (ref 26.6–33.0)
MCHC: 30.9 g/dL — ABNORMAL LOW (ref 31.5–35.7)
MCV: 88 fL (ref 79–97)
Monocytes Absolute: 0.4 x10E3/uL (ref 0.1–0.9)
Monocytes: 8 %
Neutrophils Absolute: 2.5 x10E3/uL (ref 1.4–7.0)
Neutrophils: 53 %
Platelets: 145 x10E3/uL — ABNORMAL LOW (ref 150–450)
RBC: 4.94 x10E6/uL (ref 3.77–5.28)
RDW: 14.2 % (ref 11.7–15.4)
WBC: 4.7 x10E3/uL (ref 3.4–10.8)

## 2024-07-30 LAB — CMP14+EGFR
ALT: 25 IU/L (ref 0–32)
AST: 30 IU/L (ref 0–40)
Albumin: 4.3 g/dL (ref 3.9–4.9)
Alkaline Phosphatase: 142 IU/L — ABNORMAL HIGH (ref 49–135)
BUN/Creatinine Ratio: 9 — ABNORMAL LOW (ref 12–28)
BUN: 6 mg/dL — ABNORMAL LOW (ref 8–27)
Bilirubin Total: 1.2 mg/dL (ref 0.0–1.2)
CO2: 25 mmol/L (ref 20–29)
Calcium: 9.3 mg/dL (ref 8.7–10.3)
Chloride: 106 mmol/L (ref 96–106)
Creatinine, Ser: 0.7 mg/dL (ref 0.57–1.00)
Globulin, Total: 2.4 g/dL (ref 1.5–4.5)
Glucose: 103 mg/dL — ABNORMAL HIGH (ref 70–99)
Potassium: 3.7 mmol/L (ref 3.5–5.2)
Sodium: 144 mmol/L (ref 134–144)
Total Protein: 6.7 g/dL (ref 6.0–8.5)
eGFR: 98 mL/min/1.73 (ref >59)

## 2024-07-30 LAB — LIPID PANEL
Chol/HDL Ratio: 3.8 ratio (ref 0.0–4.4)
Cholesterol, Total: 171 mg/dL (ref 100–199)
HDL: 45 mg/dL (ref >39)
LDL Chol Calc (NIH): 101 mg/dL — ABNORMAL HIGH (ref 0–99)
Triglycerides: 143 mg/dL (ref 0–149)
VLDL Cholesterol Cal: 25 mg/dL (ref 5–40)

## 2024-07-30 LAB — HEMOGLOBIN A1C
Est. average glucose Bld gHb Est-mCnc: 134 mg/dL
Hgb A1c MFr Bld: 6.3 % — ABNORMAL HIGH (ref 4.8–5.6)

## 2024-07-30 NOTE — Progress Notes (Signed)
 Established Patient Office Visit  Subjective:  Patient ID: Gabriella Young, female    DOB: 06-21-1961  Age: 63 y.o. MRN: [id number]  Chief Complaint  Patient presents with   Follow-up    4 month follow up    Patient in office for 4 month follow up, discuss recent lab work. Patient doing well, no new complaints today. Patient reports blood pressure elevated yesterday and today, took an extra 1/2 tablet lisinopril . Patient will continue to monitor blood pressure, if it stays elevated, will notify office.  Discussed recent lab results. Patient reports being compliant with medications. Will continue same medications and recheck in 3 months. Seeing endocrinology in 2 weeks. Due for appointment with cardiology, recommend scheduling before leaving today.     No other concerns at this time.   Past Medical History:  Diagnosis Date   Anemia    HX OF/ RESOLVED AFTER HYSTERECTOMY   Arthritis    HAND,FEET,KNEES   Bunion right   08.21.2014, surgery scheduled 9.26.14   Bursitis 06/20/2013   plantar aspect of hallux right   Congenital absence of one kidney    RIGHT KIDNEY NEVER DEVELOPED, REMOVED AT 63 YEARS OLD   Dental crowns present    VENEERS ON FRONT UPPER TEETH   Diabetes mellitus without complication (HCC)    High cholesterol    Hypertension    CONTROLLED ON MEDS   Legally blind    WEARS CONTACTS   Plantar fasciitis of right foot 05/23/2013   WITH LATERAL COMPENSATORY SYNDROME   Seasonal allergies    Sesamoiditis 06/20/2013   HALLUX RIGHT AND HALLUX INTERPHALANGEUM   Sleep apnea    Tachycardia    ON MEDS/ DR GORMAN BATHE    Vertigo    HX OF THREE YEARS AGO   Wears contact lenses     Past Surgical History:  Procedure Laterality Date   ABDOMINAL HYSTERECTOMY     BREAST BIOPSY Left 2013   excisional-benign   COLONOSCOPY N/A 04/16/2024   Procedure: COLONOSCOPY;  Surgeon: Jinny Carmine, MD;  Location: [facility];  Service: Endoscopy;  Laterality: N/A;   ESOPHAGEAL DILATION  N/A 07/07/2016   Procedure: ESOPHAGOGASTRODUODENOSCOPY (EGD);  Surgeon: Carmine Jinny, MD;  Location: [facility];  Service: Endoscopy;  Laterality: N/A;   HEMOSTASIS CLIP PLACEMENT  04/16/2024   Procedure: CONTROL OF HEMORRHAGE, GI TRACT, ENDOSCOPIC, BY CLIPPING OR OVERSEWING;  Surgeon: Jinny Carmine, MD;  Location: [facility];  Service: Endoscopy;;   KIDNEY SURGERY Right    REMOVED   PARTIAL HYSTERECTOMY     POLYPECTOMY  04/16/2024   Procedure: POLYPECTOMY, INTESTINE;  Surgeon: Jinny Carmine, MD;  Location: [facility];  Service: Endoscopy;;   RHINOPLASTY     SUBMUCOSAL INJECTION  04/16/2024   Procedure: INJECTION, SUBMUCOSAL;  Surgeon: Jinny Carmine, MD;  Location: [facility];  Service: Endoscopy;;   TOE SURGERY Right    screw in great toe   TONSILECTOMY, ADENOIDECTOMY, BILATERAL MYRINGOTOMY AND TUBES     TUBAL LIGATION      Social History   Socioeconomic History   Marital status: Divorced    Spouse name: Alm   Number of children: 2   Years of education: 16+   Highest education level: Not on file  Occupational History   Occupation: Unemployed  Tobacco Use   Smoking status: Never   Smokeless tobacco: Never  Vaping Use   Vaping status: Never Used  Substance and Sexual Activity   Alcohol use: No   Drug use: No   Sexual  activity: Not Currently  Other Topics Concern   Not on file  Social History Narrative   Lives with husband, Alm   Caffeine use: 1 cup coffee 3x/week   Peps[employer] with meals sometimes    Social Drivers of Corporate investment banker Strain: Not on file  Food Insecurity: Not on file  Transportation Needs: Not on file  Physical Activity: Not on file  Stress: Not on file  Social Connections: Not on file  Intimate Partner Violence: Not on file    Family History  Problem Relation Age of Onset   Diabetes Mother    Dementia Mother    Diabetes Father    Heart attack Father    Diabetes Maternal Aunt    Breast cancer Paternal Aunt 52    Diabetes Maternal Grandmother    Dementia Maternal Grandmother    Stroke Maternal Grandmother     Allergies  Allergen Reactions   Codeine Other (See Comments)    MAKES DROWSY/ NOT REALLY ALLERGY, DOESN'T LIKE THE FEELING   Sulfa Antibiotics Hives   Sulfasalazine Hives    Outpatient Medications Prior to Visit  Medication Sig   clonazePAM (KLONOPIN) 0.5 MG tablet Take by mouth.   Continuous Glucose Receiver (DEXCOM G7 RECEIVER) DEVI 2 mg by Does not apply route once a week.   Continuous Glucose Sensor (DEXCOM G7 SENSOR) MISC 2 mg by Does not apply route once a week.   fluticasone (FLONASE) 50 MCG/ACT nasal spray Place 2 sprays into both nostrils 2 (two) times daily.   gabapentin  (NEURONTIN ) 100 MG capsule Take 1 capsule (100 mg total) by mouth daily.   insulin  degludec (TRESIBA  FLEXTOUCH) 100 UNIT/ML FlexTouch Pen Inject 11 Units into the skin at bedtime.   insulin  lispro (HUMALOG  KWIKPEN) 100 UNIT/ML KwikPen Inject 7 Units into the skin 3 (three) times daily.   Insulin  Pen Needle 32G X 4 MM MISC 1 each by Does not apply route in the morning, at noon, in the evening, and at bedtime.   lisinopril  (ZESTRIL ) 10 MG tablet Take 1 tablet (10 mg total) by mouth daily. (Patient taking differently: Take 15 mg by mouth daily.)   metoprolol  succinate (TOPROL  XL) 25 MG 24 hr tablet Take 1 tablet (25 mg total) by mouth daily.   Multiple Vitamin (MULTIVITAMIN) tablet Take 1 tablet by mouth daily.   ondansetron  (ZOFRAN -ODT) 4 MG disintegrating tablet Take 1 tablet (4 mg total) by mouth every 8 (eight) hours as needed for nausea or vomiting.   pravastatin (PRAVACHOL) 40 MG tablet TAKE 1 TABLET BY MOUTH EVERY EVENING   Semaglutide (OZEMPIC, 2 MG/DOSE, [place]) Inject 2 mg into the skin once a week.   venlafaxine  XR (EFFEXOR -XR) 37.5 MG 24 hr capsule Take 1 capsule (37.5 mg total) by mouth daily.   No facility-administered medications prior to visit.    Review of Systems  Constitutional: Negative.   HENT:  Negative.    Eyes: Negative.   Respiratory: Negative.  Negative for shortness of breath.   Cardiovascular: Negative.  Negative for chest pain.  Gastrointestinal: Negative.  Negative for abdominal pain, constipation and diarrhea.  Genitourinary: Negative.   Musculoskeletal:  Negative for joint pain and myalgias.  Skin: Negative.   Neurological: Negative.  Negative for dizziness and headaches.  Endo/Heme/Allergies: Negative.   All other systems reviewed and are negative.      Objective:   BP 135/86   Pulse (!) 109   Ht 5' 8 (1.727 m)   Wt 171 lb (77.6 kg)  SpO2 97%   BMI 26.00 kg/m   Vitals:   07/30/24 1051  BP: 135/86  Pulse: (!) 109  Height: 5' 8 (1.727 m)  Weight: 171 lb (77.6 kg)  SpO2: 97%  BMI (Calculated): 26.01    Physical Exam Vitals and nursing note reviewed.  Constitutional:      Appearance: Normal appearance. She is normal weight.  HENT:     Head: Normocephalic and atraumatic.     Nose: Nose normal.     Mouth/Throat:     Mouth: Mucous membranes are moist.  Eyes:     Extraocular Movements: Extraocular movements intact.     Conjunctiva/sclera: Conjunctivae normal.     Pupils: Pupils are equal, round, and reactive to light.  Cardiovascular:     Rate and Rhythm: Normal rate and regular rhythm.     Pulses: Normal pulses.     Heart sounds: Normal heart sounds.  Pulmonary:     Effort: Pulmonary effort is normal.     Breath sounds: Normal breath sounds.  Abdominal:     General: Abdomen is flat. Bowel sounds are normal.     Palpations: Abdomen is soft.  Musculoskeletal:        General: Normal range of motion.     Cervical back: Normal range of motion.  Skin:    General: Skin is warm and dry.  Neurological:     General: No focal deficit present.     Mental Status: She is alert and oriented to person, place, and time.  Psychiatric:        Mood and Affect: Mood normal.        Behavior: Behavior normal.        Thought Content: Thought content  normal.        Judgment: Judgment normal.      No results found for any visits on 07/30/24.  Recent Results (from the past 2160 hours)  Hemoglobin A1c     Status: Abnormal   Collection Time: 07/29/24  9:57 AM  Result Value Ref Range   Hgb A1c MFr Bld 6.3 (H) 4.8 - 5.6 %    Comment:          Prediabetes: 5.7 - 6.4          Diabetes: >6.4          Glycemic control for adults with diabetes: <7.0    Est. average glucose Bld gHb Est-mCnc 134 mg/dL  CBC with Differential/Platelet     Status: Abnormal   Collection Time: 07/29/24  9:57 AM  Result Value Ref Range   WBC 4.7 3.4 - 10.8 x10E3/uL   RBC 4.94 3.77 - 5.28 x10E6/uL   Hemoglobin 13.4 11.1 - 15.9 g/dL   Hematocrit 56.6 65.9 - 46.6 %   MCV 88 79 - 97 fL   MCH 27.1 26.6 - 33.0 pg   MCHC 30.9 (L) 31.5 - 35.7 g/dL   RDW 85.7 88.2 - 84.5 %   Platelets 145 (L) 150 - 450 x10E3/uL   Neutrophils 53 Not Estab. %   Lymphs 36 Not Estab. %   Monocytes 8 Not Estab. %   Eos 2 Not Estab. %   Basos 1 Not Estab. %   Neutrophils Absolute 2.5 1.4 - 7.0 x10E3/uL   Lymphocytes Absolute 1.7 0.7 - 3.1 x10E3/uL   Monocytes Absolute 0.4 0.1 - 0.9 x10E3/uL   EOS (ABSOLUTE) 0.1 0.0 - 0.4 x10E3/uL   Basophils Absolute 0.0 0.0 - 0.2 x10E3/uL   Immature Granulocytes 0 Not  Estab. %   Immature Grans (Abs) 0.0 0.0 - 0.1 x10E3/uL  Lipid panel     Status: Abnormal   Collection Time: 07/29/24  9:57 AM  Result Value Ref Range   Cholesterol, Total 171 100 - 199 mg/dL   Triglycerides 856 0 - 149 mg/dL   HDL 45 >60 mg/dL   VLDL Cholesterol Cal 25 5 - 40 mg/dL   LDL Chol Calc (NIH) 898 (H) 0 - 99 mg/dL   Chol/HDL Ratio 3.8 0.0 - 4.4 ratio    Comment:                                   T. Chol/HDL Ratio                                             Men  Women                               1/2 Avg.Risk  3.4    3.3                                   Avg.Risk  5.0    4.4                                2X Avg.Risk  9.6    7.1                                3X  Avg.Risk 23.4   11.0   CMP14+EGFR     Status: Abnormal   Collection Time: 07/29/24  9:57 AM  Result Value Ref Range   Glucose 103 (H) 70 - 99 mg/dL   BUN 6 (L) 8 - 27 mg/dL   Creatinine, Ser 9.29 0.57 - 1.00 mg/dL   eGFR 98 >40 fO/fpw/8.26   BUN/Creatinine Ratio 9 (L) 12 - 28   Sodium 144 134 - 144 mmol/L   Potassium 3.7 3.5 - 5.2 mmol/L   Chloride 106 96 - 106 mmol/L   CO2 25 20 - 29 mmol/L   Calcium 9.3 8.7 - 10.3 mg/dL   Total Protein 6.7 6.0 - 8.5 g/dL   Albumin 4.3 3.9 - 4.9 g/dL   Globulin, Total 2.4 1.5 - 4.5 g/dL   Bilirubin Total 1.2 0.0 - 1.2 mg/dL   Alkaline Phosphatase 142 (H) 49 - 135 IU/L   AST 30 0 - 40 IU/L   ALT 25 0 - 32 IU/L      Assessment & Plan:  Continue to monitor blood pressure at home Schedule with cardiology Continue same medications  Problem List Items Addressed This Visit       Cardiovascular and Mediastinum   Benign essential hypertension - Primary     Endocrine   Poorly controlled type 2 diabetes mellitus with complication (HCC)     Other   Hyperlipidemia    Return in about 4 months (around 11/29/2024) for fasting labs prior.   Total time spent: 25 minutes  [employer], NP  07/30/2024   This document may have been  prepared by [employer] and as such may include unintentional dictation errors.

## 2024-08-09 ENCOUNTER — Ambulatory Visit: Admitting: Cardiovascular Disease

## 2024-08-09 ENCOUNTER — Other Ambulatory Visit: Payer: Self-pay | Admitting: Cardiology

## 2024-08-10 ENCOUNTER — Other Ambulatory Visit: Payer: Self-pay | Admitting: Cardiovascular Disease

## 2024-08-10 DIAGNOSIS — I10 Essential (primary) hypertension: Secondary | ICD-10-CM

## 2024-08-10 DIAGNOSIS — E782 Mixed hyperlipidemia: Secondary | ICD-10-CM

## 2024-08-10 DIAGNOSIS — I259 Chronic ischemic heart disease, unspecified: Secondary | ICD-10-CM

## 2024-08-10 DIAGNOSIS — R000 Tachycardia, unspecified: Secondary | ICD-10-CM

## 2024-08-12 ENCOUNTER — Ambulatory Visit: Admitting: Cardiovascular Disease

## 2024-08-12 ENCOUNTER — Encounter: Payer: Self-pay | Admitting: Cardiovascular Disease

## 2024-08-12 VITALS — BP 132/80 | HR 105 | Ht 68.0 in | Wt 170.0 lb

## 2024-08-12 DIAGNOSIS — R000 Tachycardia, unspecified: Secondary | ICD-10-CM

## 2024-08-12 DIAGNOSIS — E782 Mixed hyperlipidemia: Secondary | ICD-10-CM

## 2024-08-12 DIAGNOSIS — G4733 Obstructive sleep apnea (adult) (pediatric): Secondary | ICD-10-CM | POA: Diagnosis not present

## 2024-08-12 DIAGNOSIS — I10 Essential (primary) hypertension: Secondary | ICD-10-CM | POA: Diagnosis not present

## 2024-08-12 MED ORDER — LISINOPRIL 2.5 MG PO TABS: 2.5000 mg | ORAL_TABLET | Freq: Every day | ORAL | 11 refills | Status: AC

## 2024-08-12 MED ORDER — DILTIAZEM HCL ER COATED BEADS 120 MG PO CP24: 120.0000 mg | ORAL_CAPSULE | Freq: Every day | ORAL | 2 refills | Status: DC

## 2024-08-12 NOTE — Progress Notes (Signed)
 Cardiology Office Note   Date:  08/12/2024   ID:  Gabriella Young, DOB [birth year], MRN [id number]  PCP:  Carin Gauze, NP  Cardiologist:  Denyse Bathe, MD      History of Present Illness: Gabriella Young is a 63 y.o. female who presents for  Chief Complaint  Patient presents with   Follow-up    Follow up    Has occasional palpitation as coreg caused headaches.      Past Medical History:  Diagnosis Date   Anemia    HX OF/ RESOLVED AFTER HYSTERECTOMY   Arthritis    HAND,FEET,KNEES   Bunion right   08.21.2014, surgery scheduled 9.26.14   Bursitis 06/20/2013   plantar aspect of hallux right   Congenital absence of one kidney    RIGHT KIDNEY NEVER DEVELOPED, REMOVED AT 63 YEARS OLD   Dental crowns present    VENEERS ON FRONT UPPER TEETH   Diabetes mellitus without complication (HCC)    High cholesterol    Hypertension    CONTROLLED ON MEDS   Legally blind    WEARS CONTACTS   Plantar fasciitis of right foot 05/23/2013   WITH LATERAL COMPENSATORY SYNDROME   Seasonal allergies    Sesamoiditis 06/20/2013   HALLUX RIGHT AND HALLUX INTERPHALANGEUM   Sleep apnea    Tachycardia    ON MEDS/ DR GORMAN BATHE    Vertigo    HX OF THREE YEARS AGO   Wears contact lenses      Past Surgical History:  Procedure Laterality Date   ABDOMINAL HYSTERECTOMY     BREAST BIOPSY Left 2013   excisional-benign   COLONOSCOPY N/A 04/16/2024   Procedure: COLONOSCOPY;  Surgeon: Jinny Carmine, MD;  Location: [facility];  Service: Endoscopy;  Laterality: N/A;   ESOPHAGEAL DILATION N/A 07/07/2016   Procedure: ESOPHAGOGASTRODUODENOSCOPY (EGD);  Surgeon: Carmine Jinny, MD;  Location: [facility];  Service: Endoscopy;  Laterality: N/A;   HEMOSTASIS CLIP PLACEMENT  04/16/2024   Procedure: CONTROL OF HEMORRHAGE, GI TRACT, ENDOSCOPIC, BY CLIPPING OR OVERSEWING;  Surgeon: Jinny Carmine, MD;  Location: [facility];  Service: Endoscopy;;   KIDNEY SURGERY Right    REMOVED   PARTIAL  HYSTERECTOMY     POLYPECTOMY  04/16/2024   Procedure: POLYPECTOMY, INTESTINE;  Surgeon: Jinny Carmine, MD;  Location: [facility];  Service: Endoscopy;;   RHINOPLASTY     SUBMUCOSAL INJECTION  04/16/2024   Procedure: INJECTION, SUBMUCOSAL;  Surgeon: Jinny Carmine, MD;  Location: [facility];  Service: Endoscopy;;   TOE SURGERY Right    screw in great toe   TONSILECTOMY, ADENOIDECTOMY, BILATERAL MYRINGOTOMY AND TUBES     TUBAL LIGATION       Current Outpatient Medications  Medication Sig Dispense Refill   diltiazem  (CARTIA  XT) 120 MG 24 hr capsule Take 1 capsule (120 mg total) by mouth daily. 30 capsule 2   lisinopril  (ZESTRIL ) 2.5 MG tablet Take 1 tablet (2.5 mg total) by mouth daily. 30 tablet 11   clonazePAM (KLONOPIN) 0.5 MG tablet Take by mouth.     Continuous Glucose Receiver (DEXCOM G7 RECEIVER) DEVI 2 mg by Does not apply route once a week.     Continuous Glucose Sensor (DEXCOM G7 SENSOR) MISC 2 mg by Does not apply route once a week.     fluticasone (FLONASE) 50 MCG/ACT nasal spray Place 2 sprays into both nostrils 2 (two) times daily.     gabapentin  (NEURONTIN ) 100 MG capsule Take 1 capsule (100 mg total) by mouth  daily. 30 capsule 2   insulin  degludec (TRESIBA  FLEXTOUCH) 100 UNIT/ML FlexTouch Pen Inject 11 Units into the skin at bedtime.     insulin  lispro (HUMALOG  KWIKPEN) 100 UNIT/ML KwikPen Inject 7 Units into the skin 3 (three) times daily. 15 mL 11   Insulin  Pen Needle 32G X 4 MM MISC 1 each by Does not apply route in the morning, at noon, in the evening, and at bedtime. 120 each 3   Multiple Vitamin (MULTIVITAMIN) tablet Take 1 tablet by mouth daily.     ondansetron  (ZOFRAN -ODT) 4 MG disintegrating tablet Take 1 tablet (4 mg total) by mouth every 8 (eight) hours as needed for nausea or vomiting. 15 tablet 0   pravastatin (PRAVACHOL) 40 MG tablet TAKE 1 TABLET BY MOUTH EVERY EVENING 90 tablet 0   Semaglutide (OZEMPIC, 2 MG/DOSE, [place]) Inject 2 mg into the skin once a week.      venlafaxine  XR (EFFEXOR -XR) 37.5 MG 24 hr capsule Take 1 capsule (37.5 mg total) by mouth daily. 90 capsule 2   No current facility-administered medications for this visit.    Allergies:   Codeine, Sulfa antibiotics, and Sulfasalazine    Social History:   reports that she has never smoked. She has never used smokeless tobacco. She reports that she does not drink alcohol and does not use drugs.   Family History:  family history includes Breast cancer (age of onset: 83) in her paternal aunt; Dementia in her maternal grandmother and mother; Diabetes in her father, maternal aunt, maternal grandmother, and mother; Heart attack in her father; Stroke in her maternal grandmother.    ROS:     Review of Systems  Constitutional: Negative.   HENT: Negative.    Eyes: Negative.   Respiratory: Negative.    Gastrointestinal: Negative.   Genitourinary: Negative.   Musculoskeletal: Negative.   Skin: Negative.   Neurological: Negative.   Endo/Heme/Allergies: Negative.   Psychiatric/Behavioral: Negative.    All other systems reviewed and are negative.     All other systems are reviewed and negative.    PHYSICAL EXAM: VS:  BP 132/80   Pulse (!) 105   Ht 5' 8 (1.727 m)   Wt 170 lb (77.1 kg)   SpO2 93%   BMI 25.85 kg/m  , BMI Body mass index is 25.85 kg/m. Last weight:  Wt Readings from Last 3 Encounters:  08/12/24 170 lb (77.1 kg)  07/30/24 171 lb (77.6 kg)  04/16/24 172 lb 3.2 oz (78.1 kg)     Physical Exam Constitutional:      Appearance: Normal appearance.  Cardiovascular:     Rate and Rhythm: Normal rate and regular rhythm.     Heart sounds: Normal heart sounds.  Pulmonary:     Effort: Pulmonary effort is normal.     Breath sounds: Normal breath sounds.  Musculoskeletal:     Right lower leg: No edema.     Left lower leg: No edema.  Neurological:     Mental Status: She is alert.       EKG:   Recent Labs: 07/29/2024: ALT 25; BUN 6; Creatinine, Ser 0.70;  Hemoglobin 13.4; Platelets 145; Potassium 3.7; Sodium 144    Lipid Panel    Component Value Date/Time   CHOL 171 07/29/2024 0957   TRIG 143 07/29/2024 0957   HDL 45 07/29/2024 0957   CHOLHDL 3.8 07/29/2024 0957   LDLCALC 101 (H) 07/29/2024 0957      Other studies Reviewed: Additional studies/ records that were reviewed  today include:  Review of the above records demonstrates:       No data to display            ASSESSMENT AND PLAN:    ICD-10-CM   1. Benign essential hypertension  I10 lisinopril  (ZESTRIL ) 2.5 MG tablet    PCV ECHOCARDIOGRAM COMPLETE    2. Obstructive sleep apnea syndrome  G47.33 lisinopril  (ZESTRIL ) 2.5 MG tablet    PCV ECHOCARDIOGRAM COMPLETE    3. Mixed hyperlipidemia  E78.2 lisinopril  (ZESTRIL ) 2.5 MG tablet    PCV ECHOCARDIOGRAM COMPLETE    4. Sinus tachycardia  R00.0 lisinopril  (ZESTRIL ) 2.5 MG tablet    PCV ECHOCARDIOGRAM COMPLETE   feels sleepiness with metoprolol , thus will start cardizem  cd 120 and change lisinopril  to 2.5 as will lower BP too much.       Problem List Items Addressed This Visit       Cardiovascular and Mediastinum   Benign essential hypertension - Primary   Relevant Medications   diltiazem  (CARTIA  XT) 120 MG 24 hr capsule   lisinopril  (ZESTRIL ) 2.5 MG tablet   Other Relevant Orders   PCV ECHOCARDIOGRAM COMPLETE     Respiratory   Obstructive sleep apnea syndrome   Relevant Medications   lisinopril  (ZESTRIL ) 2.5 MG tablet   Other Relevant Orders   PCV ECHOCARDIOGRAM COMPLETE     Other   Hyperlipidemia   Relevant Medications   diltiazem  (CARTIA  XT) 120 MG 24 hr capsule   lisinopril  (ZESTRIL ) 2.5 MG tablet   Other Relevant Orders   PCV ECHOCARDIOGRAM COMPLETE   Sinus tachycardia   Relevant Medications   lisinopril  (ZESTRIL ) 2.5 MG tablet   Other Relevant Orders   PCV ECHOCARDIOGRAM COMPLETE       Disposition:   Return in about 3 weeks (around 09/02/2024) for echo and f/u.    Total time spent: 30  minutes  Signed,  Denyse Bathe, MD  08/12/2024 1:28 PM    [facility]

## 2024-08-25 ENCOUNTER — Other Ambulatory Visit: Payer: Self-pay | Admitting: Cardiology

## 2024-08-30 ENCOUNTER — Ambulatory Visit (INDEPENDENT_AMBULATORY_CARE_PROVIDER_SITE_OTHER)

## 2024-08-30 DIAGNOSIS — E782 Mixed hyperlipidemia: Secondary | ICD-10-CM

## 2024-08-30 DIAGNOSIS — I371 Nonrheumatic pulmonary valve insufficiency: Secondary | ICD-10-CM | POA: Diagnosis not present

## 2024-08-30 DIAGNOSIS — I10 Essential (primary) hypertension: Secondary | ICD-10-CM

## 2024-08-30 DIAGNOSIS — I340 Nonrheumatic mitral (valve) insufficiency: Secondary | ICD-10-CM

## 2024-08-30 DIAGNOSIS — I361 Nonrheumatic tricuspid (valve) insufficiency: Secondary | ICD-10-CM | POA: Diagnosis not present

## 2024-08-30 DIAGNOSIS — R000 Tachycardia, unspecified: Secondary | ICD-10-CM

## 2024-08-30 DIAGNOSIS — G4733 Obstructive sleep apnea (adult) (pediatric): Secondary | ICD-10-CM

## 2024-09-03 ENCOUNTER — Encounter: Payer: Self-pay | Admitting: Cardiovascular Disease

## 2024-09-03 ENCOUNTER — Ambulatory Visit: Admitting: Cardiovascular Disease

## 2024-09-03 VITALS — BP 126/82 | HR 96 | Ht 68.0 in | Wt 170.0 lb

## 2024-09-03 DIAGNOSIS — E1165 Type 2 diabetes mellitus with hyperglycemia: Secondary | ICD-10-CM

## 2024-09-03 DIAGNOSIS — I10 Essential (primary) hypertension: Secondary | ICD-10-CM

## 2024-09-03 DIAGNOSIS — R000 Tachycardia, unspecified: Secondary | ICD-10-CM | POA: Diagnosis not present

## 2024-09-03 DIAGNOSIS — I259 Chronic ischemic heart disease, unspecified: Secondary | ICD-10-CM

## 2024-09-03 DIAGNOSIS — G4733 Obstructive sleep apnea (adult) (pediatric): Secondary | ICD-10-CM

## 2024-09-03 DIAGNOSIS — E782 Mixed hyperlipidemia: Secondary | ICD-10-CM | POA: Diagnosis not present

## 2024-09-03 NOTE — Progress Notes (Signed)
 Cardiology Office Note   Date:  09/03/2024   ID:  MEILING HENDRIKS, DOB 1961/10/24, MRN [id number]  PCP:  Carin Gauze, NP  Cardiologist:  Denyse Bathe, MD      History of Present Illness: Gabriella Young is a 63 y.o. female who presents for  Chief Complaint  Patient presents with   Follow-up    Echo results    Feels much better after taking medications.      Past Medical History:  Diagnosis Date   Anemia    HX OF/ RESOLVED AFTER HYSTERECTOMY   Arthritis    HAND,FEET,KNEES   Bunion right   08.21.2014, surgery scheduled 9.26.14   Bursitis 06/20/2013   plantar aspect of hallux right   Congenital absence of one kidney    RIGHT KIDNEY NEVER DEVELOPED, REMOVED AT 63 YEARS OLD   Dental crowns present    VENEERS ON FRONT UPPER TEETH   Diabetes mellitus without complication (HCC)    High cholesterol    Hypertension    CONTROLLED ON MEDS   Legally blind    WEARS CONTACTS   Plantar fasciitis of right foot 05/23/2013   WITH LATERAL COMPENSATORY SYNDROME   Seasonal allergies    Sesamoiditis 06/20/2013   HALLUX RIGHT AND HALLUX INTERPHALANGEUM   Sleep apnea    Tachycardia    ON MEDS/ DR GORMAN BATHE    Vertigo    HX OF THREE YEARS AGO   Wears contact lenses      Past Surgical History:  Procedure Laterality Date   ABDOMINAL HYSTERECTOMY     BREAST BIOPSY Left 2013   excisional-benign   COLONOSCOPY N/A 04/16/2024   Procedure: COLONOSCOPY;  Surgeon: Jinny Carmine, MD;  Location: [facility];  Service: Endoscopy;  Laterality: N/A;   ESOPHAGEAL DILATION N/A 07/07/2016   Procedure: ESOPHAGOGASTRODUODENOSCOPY (EGD);  Surgeon: Carmine Jinny, MD;  Location: [facility];  Service: Endoscopy;  Laterality: N/A;   HEMOSTASIS CLIP PLACEMENT  04/16/2024   Procedure: CONTROL OF HEMORRHAGE, GI TRACT, ENDOSCOPIC, BY CLIPPING OR OVERSEWING;  Surgeon: Jinny Carmine, MD;  Location: [facility];  Service: Endoscopy;;   KIDNEY SURGERY Right    REMOVED   PARTIAL  HYSTERECTOMY     POLYPECTOMY  04/16/2024   Procedure: POLYPECTOMY, INTESTINE;  Surgeon: Jinny Carmine, MD;  Location: [facility];  Service: Endoscopy;;   RHINOPLASTY     SUBMUCOSAL INJECTION  04/16/2024   Procedure: INJECTION, SUBMUCOSAL;  Surgeon: Jinny Carmine, MD;  Location: [facility];  Service: Endoscopy;;   TOE SURGERY Right    screw in great toe   TONSILECTOMY, ADENOIDECTOMY, BILATERAL MYRINGOTOMY AND TUBES     TUBAL LIGATION       Current Outpatient Medications  Medication Sig Dispense Refill   clonazePAM (KLONOPIN) 0.5 MG tablet Take by mouth.     Continuous Glucose Receiver (DEXCOM G7 RECEIVER) DEVI 2 mg by Does not apply route once a week.     Continuous Glucose Sensor (DEXCOM G7 SENSOR) MISC 2 mg by Does not apply route once a week.     diltiazem  (CARTIA  XT) 120 MG 24 hr capsule Take 1 capsule (120 mg total) by mouth daily. 30 capsule 2   fluticasone (FLONASE) 50 MCG/ACT nasal spray Place 2 sprays into both nostrils 2 (two) times daily.     gabapentin  (NEURONTIN ) 100 MG capsule Take 1 capsule (100 mg total) by mouth daily. 30 capsule 2   insulin  degludec (TRESIBA  FLEXTOUCH) 100 UNIT/ML FlexTouch Pen Inject 11 Units into the skin  at bedtime.     insulin  lispro (HUMALOG  KWIKPEN) 100 UNIT/ML KwikPen Inject 7 Units into the skin 3 (three) times daily. 15 mL 11   Insulin  Pen Needle 32G X 4 MM MISC 1 each by Does not apply route in the morning, at noon, in the evening, and at bedtime. 120 each 3   lisinopril  (ZESTRIL ) 2.5 MG tablet Take 1 tablet (2.5 mg total) by mouth daily. 30 tablet 11   Multiple Vitamin (MULTIVITAMIN) tablet Take 1 tablet by mouth daily.     ondansetron  (ZOFRAN -ODT) 4 MG disintegrating tablet Take 1 tablet (4 mg total) by mouth every 8 (eight) hours as needed for nausea or vomiting. 15 tablet 0   pravastatin (PRAVACHOL) 40 MG tablet TAKE 1 TABLET BY MOUTH EVERY EVENING 90 tablet 0   Semaglutide (OZEMPIC, 2 MG/DOSE, [place]) Inject 2 mg into the skin once a week.      venlafaxine  XR (EFFEXOR -XR) 37.5 MG 24 hr capsule Take 1 capsule (37.5 mg total) by mouth daily. 90 capsule 2   No current facility-administered medications for this visit.    Allergies:   Codeine, Sulfa antibiotics, and Sulfasalazine    Social History:   reports that she has never smoked. She has never used smokeless tobacco. She reports that she does not drink alcohol and does not use drugs.   Family History:  family history includes Breast cancer (age of onset: 75) in her paternal aunt; Dementia in her maternal grandmother and mother; Diabetes in her father, maternal aunt, maternal grandmother, and mother; Heart attack in her father; Stroke in her maternal grandmother.    ROS:     Review of Systems  Constitutional: Negative.   HENT: Negative.    Eyes: Negative.   Respiratory: Negative.    Gastrointestinal: Negative.   Genitourinary: Negative.   Musculoskeletal: Negative.   Skin: Negative.   Neurological: Negative.   Endo/Heme/Allergies: Negative.   Psychiatric/Behavioral: Negative.    All other systems reviewed and are negative.     All other systems are reviewed and negative.    PHYSICAL EXAM: VS:  BP 126/82   Pulse 96   Ht 5' 8 (1.727 m)   Wt 170 lb (77.1 kg)   SpO2 98%   BMI 25.85 kg/m  , BMI Body mass index is 25.85 kg/m. Last weight:  Wt Readings from Last 3 Encounters:  09/03/24 170 lb (77.1 kg)  08/12/24 170 lb (77.1 kg)  07/30/24 171 lb (77.6 kg)     Physical Exam Constitutional:      Appearance: Normal appearance.  Cardiovascular:     Rate and Rhythm: Normal rate and regular rhythm.     Heart sounds: Normal heart sounds.  Pulmonary:     Effort: Pulmonary effort is normal.     Breath sounds: Normal breath sounds.  Musculoskeletal:     Right lower leg: No edema.     Left lower leg: No edema.  Neurological:     Mental Status: She is alert.       EKG:   Recent Labs: 07/29/2024: ALT 25; BUN 6; Creatinine, Ser 0.70; Hemoglobin 13.4;  Platelets 145; Potassium 3.7; Sodium 144    Lipid Panel    Component Value Date/Time   CHOL 171 07/29/2024 0957   TRIG 143 07/29/2024 0957   HDL 45 07/29/2024 0957   CHOLHDL 3.8 07/29/2024 0957   LDLCALC 101 (H) 07/29/2024 0957      Other studies Reviewed: Additional studies/ records that were reviewed today include:  Review  of the above records demonstrates:       No data to display            ASSESSMENT AND PLAN:    ICD-10-CM   1. Benign essential hypertension  I10     2. Obstructive sleep apnea syndrome  G47.33     3. Sinus tachycardia  R00.0    HR good    4. Mixed hyperlipidemia  E78.2     5. Chest pain due to myocardial ischemia, unspecified ischemic chest pain type  I25.9    ECHO had trace MR/TR, normal EF       Problem List Items Addressed This Visit       Cardiovascular and Mediastinum   Benign essential hypertension - Primary     Respiratory   Obstructive sleep apnea syndrome     Other   Chest pain   Hyperlipidemia   Sinus tachycardia       Disposition:   Return in about 3 months (around 12/04/2024).    Total time spent: 30 minutes  Signed,  Denyse Bathe, MD  09/03/2024 10:01 AM    [facility]

## 2024-11-04 ENCOUNTER — Other Ambulatory Visit: Payer: Self-pay | Admitting: Cardiovascular Disease

## 2024-12-05 ENCOUNTER — Ambulatory Visit: Admitting: Cardiovascular Disease

## 2024-12-19 ENCOUNTER — Ambulatory Visit: Admitting: Cardiology

## 2024-12-26 ENCOUNTER — Ambulatory Visit: Admitting: Cardiovascular Disease
